# Patient Record
Sex: Female | Born: 1965 | Hispanic: Refuse to answer | Marital: Married | Smoking: Never smoker
Health system: Southern US, Community
[De-identification: ages and names within clinical notes are randomized; demographics above are authoritative.]

## PROBLEM LIST (undated history)

## (undated) DIAGNOSIS — Z8619 Personal history of other infectious and parasitic diseases: Secondary | ICD-10-CM

## (undated) DIAGNOSIS — K219 Gastro-esophageal reflux disease without esophagitis: Secondary | ICD-10-CM

## (undated) DIAGNOSIS — R12 Heartburn: Secondary | ICD-10-CM

## (undated) DIAGNOSIS — G43909 Migraine, unspecified, not intractable, without status migrainosus: Secondary | ICD-10-CM

## (undated) HISTORY — DX: Personal history of other infectious and parasitic diseases: Z86.19

## (undated) HISTORY — DX: Gastro-esophageal reflux disease without esophagitis: K21.9

## (undated) HISTORY — DX: Heartburn: R12

## (undated) HISTORY — DX: Migraine, unspecified, not intractable, without status migrainosus: G43.909

---

## 1997-04-25 HISTORY — PX: HEMORRHOIDECTOMY WITH HEMORRHOID BANDING: SHX5633

## 2013-04-25 DIAGNOSIS — B029 Zoster without complications: Secondary | ICD-10-CM

## 2013-04-25 HISTORY — DX: Zoster without complications: B02.9

## 2016-04-25 DIAGNOSIS — Z8619 Personal history of other infectious and parasitic diseases: Secondary | ICD-10-CM | POA: Insufficient documentation

## 2017-11-28 ENCOUNTER — Ambulatory Visit (INDEPENDENT_AMBULATORY_CARE_PROVIDER_SITE_OTHER): Payer: 59 | Admitting: Family Medicine

## 2017-11-28 ENCOUNTER — Encounter: Payer: Self-pay | Admitting: Family Medicine

## 2017-11-28 VITALS — BP 110/64 | HR 69 | Temp 98.4°F | Ht 67.0 in

## 2017-11-28 DIAGNOSIS — B86 Scabies: Secondary | ICD-10-CM | POA: Diagnosis not present

## 2017-11-28 MED ORDER — TRIAMCINOLONE ACETONIDE 0.1 % EX CREA: 1.0000 "application " | TOPICAL_CREAM | Freq: Two times a day (BID) | CUTANEOUS | 0 refills | Status: DC

## 2017-11-28 MED ORDER — PERMETHRIN 5 % EX CREA: TOPICAL_CREAM | CUTANEOUS | 1 refills | Status: DC

## 2017-11-28 MED ORDER — HYDROXYZINE HCL 25 MG PO TABS: 25.0000 mg | ORAL_TABLET | Freq: Three times a day (TID) | ORAL | 0 refills | Status: DC | PRN

## 2017-11-28 NOTE — Patient Instructions (Signed)
Scabies, Adult Scabies is a skin condition that happens when very small insects get under the skin (infestation). This causes a rash and severe itchiness. Scabies can spread from person to person (is contagious). If you get scabies, it is common for others in your household to get scabies too. With proper treatment, symptoms usually go away in 2-4 weeks. Scabies usually does not cause lasting problems. What are the causes? This condition is caused by mites (Sarcoptes scabiei, or human itch mites) that can only be seen with a microscope. The mites get into the top layer of skin and lay eggs. Scabies can spread from person to person through:  Close contact with a person who has scabies.  Contact with infested items, such as towels, bedding, or clothing.  What increases the risk? This condition is more likely to develop in:  People who live in nursing homes and other extended-care facilities.  People who have sexual contact with a partner who has scabies.  Young children who attend child care facilities.  People who care for others who are at increased risk for scabies.  What are the signs or symptoms? Symptoms of this condition may include:  Severe itchiness. This is often worse at night.  A rash that includes tiny red bumps or blisters. The rash commonly occurs on the wrist, elbow, armpit, fingers, waist, groin, or buttocks. Bumps may form a line (burrow) in some areas.  Skin irritation. This can include scaly patches or sores.  How is this diagnosed? This condition is diagnosed with a physical exam. Your health care provider will look closely at your skin. In some cases, your health care provider may take a sample of your affected skin (skin scraping) and have it examined under a microscope. How is this treated? This condition may be treated with:  Medicated cream or lotion that kills the mites. This is spread on the entire body and left on for several hours. Usually, one treatment  with medicated cream or lotion is enough to kill all of the mites. In severe cases, the treatment may be repeated.  Medicated cream that relieves itching.  Medicines that help to relieve itching.  Medicines that kill the mites. This treatment is rarely used.  Follow these instructions at home:  Medicines  Take or apply over-the-counter and prescription medicines as told by your health care provider.  Apply medicated cream or lotion as told by your health care provider.  Do not wash off the medicated cream or lotion until the necessary amount of time has passed. Skin Care  Avoid scratching your affected skin.  Keep your fingernails closely trimmed to reduce injury from scratching.  Take cool baths or apply cool washcloths to help reduce itching. General instructions  Clean all items that you recently had contact with, including bedding, clothing, and furniture. Do this on the same day that your treatment starts. ? Use hot water when you wash items. ? Place unwashable items into closed, airtight plastic bags for at least 3 days. The mites cannot live for more than 3 days away from human skin. ? Vacuum furniture and mattresses that you use.  Make sure that other people who may have been infested are examined by a health care provider. These include members of your household and anyone who may have had contact with infested items.  Keep all follow-up visits as told by your health care provider. This is important. Contact a health care provider if:  You have itching that does not go away   after 4 weeks of treatment.  You continue to develop new bumps or burrows.  You have redness, swelling, or pain in your rash area after treatment.  You have fluid, blood, or pus coming from your rash. This information is not intended to replace advice given to you by your health care provider. Make sure you discuss any questions you have with your health care provider. Document Released:  12/31/2014 Document Revised: 09/17/2015 Document Reviewed: 11/11/2014 Elsevier Interactive Patient Education  2018 Elsevier Inc.  

## 2017-11-28 NOTE — Progress Notes (Signed)
Patient: Katherine Sanford MRN: [id number] DOB: 01/14/1966 PCP: No primary care provider on file.     Subjective:  Chief Complaint  Patient presents with  . poss scabies  . itchy rash    HPI: The patient is a 52 y.o. female who presents today for an itchy rash and is concerned for scabies. She was in [place] last week and she started noticing the rash about the third day into her stay. Rash started on the back of her left knee. She states it spread to her right anterior chest. Itching is worse at night. She has areas on her hands, behind knee and then the anterior right chest wall. She has not put anything on it. She denies contacts with any new plants, animals. Did use new soap in [place] at the hotel.   Review of Systems  Constitutional: Negative for fatigue and fever.  HENT: Negative for congestion.   Respiratory: Negative for shortness of breath.   Cardiovascular: Negative for chest pain.  Gastrointestinal: Negative for abdominal pain and nausea.  Skin: Positive for rash.  Neurological: Negative for dizziness and headaches.  Psychiatric/Behavioral: The patient is not nervous/anxious.     Allergies Patient has No Known Allergies.  Past Medical History Patient  has a past medical history of Diabetes mellitus without complication (HCC), GERD (gastroesophageal reflux disease), and Migraines.  Surgical History Patient  has a past surgical history that includes Cesarean section (2004) and Hemorrhoidectomy with hemorrhoid banding (1999).  Family History Pateint's family history includes Alcohol abuse in her father; Diabetes in her sister; Heart attack in her father; Learning disabilities in her father; Stroke in her father.  Social History Patient  reports that she has never smoked. She has never used smokeless tobacco. She reports that she drank alcohol. She reports that she does not use drugs.    Objective: Vitals:   11/28/17 1112  BP: 110/64  Pulse: 69  Temp: 98.4 F (36.9 C)   TempSrc: Oral  SpO2: 96%  Height: 5\' 7"  (1.702 m)    There is no height or weight on file to calculate BMI.  Physical Exam  Constitutional: She appears well-developed and well-nourished.  Skin: Skin is warm and dry. Rash (very tiny erythematous papules on left wrist, right anterior chest wall and behind left knee. no burrows seen. nothing in webs of fingers or toes. ) noted.  Vitals reviewed.      Assessment/plan: 1. Scabies Not 100% convinced this is scabies. Could just be a dermatitis and such quick onset. Will treat with permethrin 5%, hydroxyzine prn for itching and can use topical steroid cream. Let me know if doesn't get better. Has f/u for establishing care/annual exam in 2 weeks. Also gave handout for clothes/bed washing, etc..     Return if symptoms worsen or fail to improve.     Orland MustardAllison Heraclio Seidman, MD [facility]  11/28/2017

## 2017-11-28 NOTE — Progress Notes (Deleted)
Patient: Katherine Sanford MRN: [id number] DOB: 08/31/1965 PCP: No primary care provider on file.     Subjective:  Chief Complaint  Patient presents with  . poss scabies  . itchy rash    HPI: The patient is a 52 y.o. female who presents today for ***  Review of Systems  Constitutional: Positive for chills. Negative for fever.  HENT: Positive for sore throat. Negative for congestion.   Respiratory: Negative for shortness of breath.   Cardiovascular: Negative for chest pain.  Gastrointestinal: Negative for abdominal pain and nausea.  Skin: Positive for rash.  Neurological: Negative for dizziness and headaches.  Psychiatric/Behavioral: The patient is not nervous/anxious.     Allergies Patient has No Known Allergies.  Past Medical History Patient  has no past medical history on file.  Surgical History Patient  has no past surgical history on file.  Family History Pateint's family history is not on file.  Social History Patient      Objective: Vitals:   11/28/17 1112  BP: 110/64  Pulse: 69  Temp: 98.4 F (36.9 C)  TempSrc: Oral  SpO2: 96%  Height: 5\' 7"  (1.702 m)    There is no height or weight on file to calculate BMI.  Physical Exam     Assessment/plan:      No follow-ups on file.     @[facility] @ 11/28/2017

## 2017-12-11 ENCOUNTER — Encounter: Payer: Self-pay | Admitting: Family Medicine

## 2017-12-11 ENCOUNTER — Ambulatory Visit: Payer: 59 | Admitting: Family Medicine

## 2017-12-11 VITALS — BP 104/70 | HR 67 | Temp 98.2°F | Ht 67.0 in | Wt 173.2 lb

## 2017-12-11 DIAGNOSIS — R5383 Other fatigue: Secondary | ICD-10-CM | POA: Diagnosis not present

## 2017-12-11 DIAGNOSIS — R140 Abdominal distension (gaseous): Secondary | ICD-10-CM | POA: Diagnosis not present

## 2017-12-11 DIAGNOSIS — Z0000 Encounter for general adult medical examination without abnormal findings: Secondary | ICD-10-CM | POA: Diagnosis not present

## 2017-12-11 DIAGNOSIS — K219 Gastro-esophageal reflux disease without esophagitis: Secondary | ICD-10-CM | POA: Diagnosis not present

## 2017-12-11 DIAGNOSIS — Z114 Encounter for screening for human immunodeficiency virus [HIV]: Secondary | ICD-10-CM

## 2017-12-11 DIAGNOSIS — Z23 Encounter for immunization: Secondary | ICD-10-CM

## 2017-12-11 DIAGNOSIS — Z1159 Encounter for screening for other viral diseases: Secondary | ICD-10-CM

## 2017-12-11 LAB — CBC WITH DIFFERENTIAL/PLATELET
BASOS PCT: 0.6 % (ref 0.0–3.0)
Basophils Absolute: 0 10*3/uL (ref 0.0–0.1)
EOS ABS: 0.1 10*3/uL (ref 0.0–0.7)
Eosinophils Relative: 1.5 % (ref 0.0–5.0)
HCT: 37.9 % (ref 36.0–46.0)
Hemoglobin: 12.5 g/dL (ref 12.0–15.0)
LYMPHS ABS: 1.6 10*3/uL (ref 0.7–4.0)
Lymphocytes Relative: 28.8 % (ref 12.0–46.0)
MCHC: 32.9 g/dL (ref 30.0–36.0)
MCV: 87.1 fl (ref 78.0–100.0)
MONO ABS: 0.4 10*3/uL (ref 0.1–1.0)
Monocytes Relative: 6.8 % (ref 3.0–12.0)
NEUTROS ABS: 3.4 10*3/uL (ref 1.4–7.7)
NEUTROS PCT: 62.3 % (ref 43.0–77.0)
PLATELETS: 362 10*3/uL (ref 150.0–400.0)
RBC: 4.35 Mil/uL (ref 3.87–5.11)
RDW: 14.5 % (ref 11.5–15.5)
WBC: 5.5 10*3/uL (ref 4.0–10.5)

## 2017-12-11 LAB — TSH: TSH: 1.89 u[IU]/mL (ref 0.35–4.50)

## 2017-12-11 LAB — COMPREHENSIVE METABOLIC PANEL
ALT: 20 U/L (ref 0–35)
AST: 18 U/L (ref 0–37)
Albumin: 4.4 g/dL (ref 3.5–5.2)
Alkaline Phosphatase: 77 U/L (ref 39–117)
BUN: 12 mg/dL (ref 6–23)
CHLORIDE: 104 meq/L (ref 96–112)
CO2: 27 meq/L (ref 19–32)
CREATININE: 0.82 mg/dL (ref 0.40–1.20)
Calcium: 10 mg/dL (ref 8.4–10.5)
GFR: 77.2 mL/min (ref >60.00)
GLUCOSE: 104 mg/dL — AB (ref 70–99)
Potassium: 4 mEq/L (ref 3.5–5.1)
SODIUM: 138 meq/L (ref 135–145)
Total Bilirubin: 0.4 mg/dL (ref 0.2–1.2)
Total Protein: 7.4 g/dL (ref 6.0–8.3)

## 2017-12-11 LAB — LIPID PANEL
CHOLESTEROL: 206 mg/dL — AB (ref 0–200)
HDL: 65.8 mg/dL (ref >39.00)
LDL Cholesterol: 125 mg/dL — ABNORMAL HIGH (ref 0–99)
NonHDL: 140.59
Total CHOL/HDL Ratio: 3
Triglycerides: 77 mg/dL (ref 0.0–149.0)
VLDL: 15.4 mg/dL (ref 0.0–40.0)

## 2017-12-11 LAB — VITAMIN D 25 HYDROXY (VIT D DEFICIENCY, FRACTURES): VITD: 27.07 ng/mL — AB (ref 30.00–100.00)

## 2017-12-11 NOTE — Patient Instructions (Signed)
-  if you need an optometrist Bunnie PionEric Ablott at [facility] is great.   -FODMOP diet for foods that cause gas/bloating  -LOTS of labs... Watch pain in left lower side and if is not going away we will need to do an ultrasound.    -start zantac 150mg  TWICE a day. If not better in a few days, email me and we will start prescription medication for the reflux   -ask your husband if you stop breathing and gasp for air at night. If so, we need to do a sleep study on you to make sure you don't have sleep apnea.

## 2017-12-11 NOTE — Progress Notes (Signed)
Patient: Katherine Sanford MRN: [id number] DOB: 1965/05/28 PCP: Orland Mustard, MD     Subjective:  Chief Complaint  Patient presents with  . Establish Care    cpe  . bloating, fatigue    HPI: The patient is a 52 y.o. female who presents today for annual exam. She denies any changes to past medical history. There have been no recent hospitalizations. They are following a well balanced diet and exercise plan. Weight has increasing. She has some issues to address today. Her mom and sister were just diagnosed with thyroid disease (hypothyroid) and diabetes. She also has other family members who have thyroid disease. She also feels more tired and bloating.   Fatigue: She noticed this about 6 months ago. She is up to date on her colonoscopy. Not her mmg or pap smear. She does feel like she bruises easily. She gest hot flashes at night, but no night sweats. No fever/chills and no weight loss. No family history of cancers. She does walk for exercise. She does not sleep much due to acid reflux. Sleeps 7-8 hours. She states her husband says she snores, but she is unsure if she gasps for air.   GERD: has history of this. Has only been taking tums with no relief and it's keeping her up at night. She has had this bad for the past month. Before this it was mild and intermittent. She has the burning in her throat and a sore throat. She has an intermittent cough. She went to [place] in [place]. No stomach pain. She had an EGD done 1.5 years ago and thinks she had h.pylori.   Bloating: she feels like she is bloated all of the time and noticed this about 6 months ago. She tries to eat healthy. She has a lot of gas as well with this. She has gained weight-30 pounds over the last year. No fh of ovarian cancer.  No diet changes over the last year. She did do a big move to [place] from [place] last year. She does have intermittent left adnexal pain, but it's never steady and goes away. She has intermittent constipation  that she has lived with her entire life.   Immunization History  Administered Date(s) Administered  . Tdap 12/11/2017     Colonoscopy: 2018. F/u in 10 years. She will get results for me.  Mammogram: needs this.  Pap smear: 8 years ago.  Tdap: today  Hep C screen today   Review of Systems  Constitutional: Positive for fatigue. Negative for chills and fever.  HENT: Negative for dental problem, ear pain, hearing loss and trouble swallowing.   Eyes: Negative for visual disturbance.  Respiratory: Negative for cough, chest tightness and shortness of breath.   Cardiovascular: Negative for chest pain, palpitations and leg swelling.  Gastrointestinal: Positive for constipation. Negative for abdominal pain, blood in stool, diarrhea and nausea.  Endocrine: Positive for heat intolerance. Negative for cold intolerance, polydipsia, polyphagia and polyuria.       C/o excessive hot flashes  Genitourinary: Negative for dysuria and hematuria.  Musculoskeletal: Negative for arthralgias and neck pain.  Skin: Negative for rash.  Neurological: Negative for dizziness and headaches.  Psychiatric/Behavioral: Positive for sleep disturbance. Negative for dysphoric mood. The patient is not nervous/anxious.     Allergies Patient has No Known Allergies.  Past Medical History Patient  has a past medical history of GERD (gastroesophageal reflux disease) and Migraines.  Surgical History Patient  has a past surgical history that includes Cesarean section (  2004) and Hemorrhoidectomy with hemorrhoid banding (1999).  Family History Pateint's family history includes Alcohol abuse in her father; Diabetes in her sister; Heart attack in her father; Learning disabilities in her father; Stroke in her father.  Social History Patient  reports that she has never smoked. She has never used smokeless tobacco. She reports that she drank alcohol. She reports that she does not use drugs.    Objective: Vitals:   12/11/17  0820  BP: 104/70  Pulse: 67  Temp: 98.2 F (36.8 C)  TempSrc: Oral  SpO2: 98%  Weight: 173 lb 3.2 oz (78.6 kg)  Height: 5\' 7"  (1.702 m)    Body mass index is 27.13 kg/m.  Physical Exam  Constitutional: She is oriented to person, place, and time. She appears well-developed and well-nourished.  HENT:  Right Ear: External ear normal.  Left Ear: External ear normal.  Mouth/Throat: Oropharynx is clear and moist.  TM pearly with light reflex bilaterally.   Eyes: Pupils are equal, round, and reactive to light. Conjunctivae and EOM are normal.  Neck: Normal range of motion. Neck supple. No thyromegaly present.  Cardiovascular: Normal rate, regular rhythm, normal heart sounds and intact distal pulses.  No murmur heard. No carotid bruits   Pulmonary/Chest: Effort normal and breath sounds normal.  Abdominal: Soft. Bowel sounds are normal. She exhibits no distension. There is tenderness (LLQ and epigastric ). There is no guarding.  Lymphadenopathy:    She has no cervical adenopathy.  Neurological: She is alert and oriented to person, place, and time. She displays normal reflexes. No cranial nerve deficit. Coordination normal.  Skin: Skin is warm and dry. No rash noted.  Dermatofibroma on left lower leg  Psychiatric: She has a normal mood and affect. Her behavior is normal.  Vitals reviewed.      Assessment/plan: 1. Need for prophylactic vaccination with combined diphtheria-tetanus-pertussis (DTP) vaccine  - Tdap vaccine greater than or equal to 7yo IM  2. Annual physical exam Routine lab work today. mmg information given and she will f/u with her GYN for pap smear. tday today and hep C screen as well. Skin check done continue sunscreen use. Work on incorporating more exercise into her routine. F/u in one year for annual Patient counseling [x]    Nutrition: Stressed importance of moderation in sodium/caffeine intake, saturated fat and cholesterol, caloric balance, sufficient intake of  fresh fruits, vegetables, fiber, calcium, iron, and 1 mg of folate supplement per day (for females capable of pregnancy).  [x]    Stressed the importance of regular exercise.   []    Substance Abuse: Discussed cessation/primary prevention of tobacco, alcohol, or other drug use; driving or other dangerous activities under the influence; availability of treatment for abuse.   [x]    Injury prevention: Discussed safety belts, safety helmets, smoke detector, smoking near bedding or upholstery.   [x]    Sexuality: Discussed sexually transmitted diseases, partner selection, use of condoms, avoidance of unintended pregnancy  and contraceptive alternatives.  [x]    Dental health: Discussed importance of regular tooth brushing, flossing, and dental visits.  [x]    Health maintenance and immunizations reviewed. Please refer to Health maintenance section.   - Comprehensive metabolic panel - CBC with Differential/Platelet - TSH - Lipid panel  3. Encounter for screening for HIV  - HIV antibody  4. Encounter for hepatitis C screening test for low risk patient  - Hepatitis C antibody  5. Bloating She has left adnexal pain on exam with bloating and weight gain. No Fh of  ovarian cancer, but will check ca-125. Handout given for FODMOP diet. utd on colonoscopy and requesting records. If not better with pain in llq will ultrasound her.  - CA 125  6. Gastroesophageal reflux disease without esophagitis H.pylori test today. Starting her on zantac bid. If not better in 1-2 weeks will start her on PPI and if h.pylori will treat.  - H. pylori breath test  7. Other fatigue Labs. F/u on sleep, may need sleep study. Increase exercise. See her back as needed/depending on lab work.  - VITAMIN D 25 Hydroxy (Vit-D Deficiency, Fractures)    Return if symptoms worsen or fail to improve.     Orland MustardAllison Letecia Arps, MD [facility]  12/11/2017

## 2017-12-12 LAB — H. PYLORI BREATH TEST: H. pylori Breath Test: NOT DETECTED

## 2017-12-13 ENCOUNTER — Other Ambulatory Visit: Payer: Self-pay | Admitting: Family Medicine

## 2017-12-13 DIAGNOSIS — E559 Vitamin D deficiency, unspecified: Secondary | ICD-10-CM | POA: Insufficient documentation

## 2017-12-13 LAB — HEPATITIS C ANTIBODY
Hepatitis C Ab: NONREACTIVE
SIGNAL TO CUT-OFF: 0.01 (ref <1.00)

## 2017-12-13 LAB — HIV ANTIBODY (ROUTINE TESTING W REFLEX): HIV: NONREACTIVE

## 2017-12-13 LAB — CA 125: CA 125: 3 U/mL (ref <35)

## 2017-12-13 MED ORDER — VITAMIN D (ERGOCALCIFEROL) 1.25 MG (50000 UNIT) PO CAPS
ORAL_CAPSULE | ORAL | 0 refills | Status: DC
Start: 2017-12-13 — End: 2019-03-06

## 2019-03-05 ENCOUNTER — Ambulatory Visit: Payer: 59 | Admitting: Family Medicine

## 2019-03-06 ENCOUNTER — Encounter: Payer: Self-pay | Admitting: Family Medicine

## 2019-03-06 ENCOUNTER — Ambulatory Visit (INDEPENDENT_AMBULATORY_CARE_PROVIDER_SITE_OTHER): Payer: 59 | Admitting: Family Medicine

## 2019-03-06 ENCOUNTER — Other Ambulatory Visit: Payer: Self-pay

## 2019-03-06 VITALS — BP 108/66 | HR 65 | Temp 97.6°F | Ht 67.0 in | Wt 156.5 lb

## 2019-03-06 DIAGNOSIS — B354 Tinea corporis: Secondary | ICD-10-CM

## 2019-03-06 MED ORDER — KETOCONAZOLE 2 % EX CREA: 1.0000 "application " | TOPICAL_CREAM | Freq: Two times a day (BID) | CUTANEOUS | 0 refills | Status: AC

## 2019-03-06 NOTE — Patient Instructions (Signed)
Please return in 2-4 weeks with Dr. Artis Flock for recheck and to discuss sleep concerns. You may also schedule a visit for a complete physical.   If you have any questions or concerns, please don't hesitate to send me a message via MyChart or call the office at [phone]. Thank you for visiting with [place] today! It's our pleasure caring for you.   Body Ringworm Body ringworm is an infection of the skin that often causes a ring-shaped rash. Body ringworm is also called tinea corporis. Body ringworm can affect any part of your skin. This condition is easily spread from person to person (is very contagious). What are the causes? This condition is caused by fungi called dermatophytes. The condition develops when these fungi grow out of control on the skin. You can get this condition if you touch a person or animal that has it. You can also get it if you share any items with an infected person or pet. These include:  Clothing, bedding, and towels.  Brushes or combs.  Gym equipment.  Any other object that has the fungus on it. What increases the risk? You are more likely to develop this condition if you:  Play sports that involve close physical contact, such as wrestling.  Sweat a lot.  Live in areas that are hot and humid.  Use public showers.  Have a weakened immune system. What are the signs or symptoms? Symptoms of this condition include:  Itchy, raised red spots and bumps.  Red scaly patches.  A ring-shaped rash. The rash may have: ? A clear center. ? Scales or red bumps at its center. ? Redness near its borders. ? Dry and scaly skin on or around it. How is this diagnosed? This condition can usually be diagnosed with a skin exam. A skin scraping may be taken from the affected area and examined under a microscope to see if the fungus is present. How is this treated? This condition may be treated with:  An antifungal cream or ointment.  An antifungal shampoo.  Antifungal  medicines. These may be prescribed if your ringworm: ? Is severe. ? Keeps coming back. ? Lasts a long time. Follow these instructions at home:  Take over-the-counter and prescription medicines only as told by your health care provider.  If you were given an antifungal cream or ointment: ? Use it as told by your health care provider. ? Wash the infected area and dry it completely before applying the cream or ointment.  If you were given an antifungal shampoo: ? Use it as told by your health care provider. ? Leave the shampoo on your body for 3-5 minutes before rinsing.  While you have a rash: ? Wear loose clothing to stop clothes from rubbing and irritating it. ? Wash or change your bed sheets every night. ? Disinfect or throw out items that may be infected. ? Wash clothes and bed sheets in hot water. ? Wash your hands often with soap and water. If soap and water are not available, use hand sanitizer.  If your pet has the same infection, take your pet to see a veterinarian for treatment. How is this prevented?  Take a bath or shower every day and after every time you work out or play sports.  Dry your skin completely after bathing.  Wear sandals or shoes in public places and showers.  Change your clothes every day.  Wash athletic clothes after each use.  Do not share personal items with others.  Avoid  touching red patches of skin on other people.  Avoid touching pets that have bald spots.  If you touch an animal that has a bald spot, wash your hands. Contact a health care provider if:  Your rash continues to spread after 7 days of treatment.  Your rash is not gone in 4 weeks.  The area around your rash gets red, warm, tender, and swollen. Summary  Body ringworm is an infection of the skin that often causes a ring-shaped rash.  This condition is easily spread from person to person (is very contagious).  This condition may be treated with antifungal cream or  ointment, antifungal shampoo, or antifungal medicines.  Take over-the-counter and prescription medicines only as told by your health care provider. This information is not intended to replace advice given to you by your health care provider. Make sure you discuss any questions you have with your health care provider. Document Released: 04/08/2000 Document Revised: 12/08/2017 Document Reviewed: 12/08/2017 Elsevier Patient Education  2020 [employer].

## 2019-03-06 NOTE — Progress Notes (Signed)
   Subjective  CC:  Chief Complaint  Patient presents with  . Rash    Rt upper chest, red     HPI: Katherine Sanford is a 53 y.o. female who presents to the office today to address the problems listed above in the chief complaint.  53 yo healthy female with one week h/o red itchy growing round rash on right upper chest. No systemic or associated sxs. Otherwise feels well. No other rash or spread. No affected family members.   HM: overdue for cpe. Declines influenza vaccination today.    Assessment  1. Tinea corporis      Plan   Ringworm, body:  Most c/w ringworm. Trial of ketoconazole. F/u if not improving.   rec cpe visit.   Follow up: No follow-ups on file.  05/08/2019  No orders of the defined types were placed in this encounter.  Meds ordered this encounter  Medications  . ketoconazole (NIZORAL) 2 % cream    Sig: Apply 1 application topically 2 (two) times daily for 7 days. Then as needed    Dispense:  30 g    Refill:  0      I reviewed the patients updated PMH, FH, and SocHx.    Patient Active Problem List   Diagnosis Date Noted  . Vitamin D deficiency 12/13/2017   Current Meds  Medication Sig  . acetaminophen (TYLENOL) 325 MG tablet Take 325 mg by mouth every 6 (six) hours as needed.    Allergies: Patient has No Known Allergies. Family History: Patient family history includes Alcohol abuse in her father; Diabetes in her sister; Heart attack in her father; Learning disabilities in her father; Stroke in her father. Social History:  Patient  reports that she has never smoked. She has never used smokeless tobacco. She reports previous alcohol use. She reports that she does not use drugs.  Review of Systems: Constitutional: Negative for fever malaise or anorexia Cardiovascular: negative for chest pain Respiratory: negative for SOB or persistent cough Gastrointestinal: negative for abdominal pain  Objective  Vitals: BP 108/66 (BP Location: Left Arm,  Patient Position: Sitting, Cuff Size: Normal)   Pulse 65   Temp 97.6 F (36.4 C) (Temporal)   Ht 5\' 7"  (1.702 m)   Wt 156 lb 8 oz (71 kg)   LMP  (LMP Unknown) Comment: LMP 2014  SpO2 99%   BMI 24.51 kg/m  General: no acute distress , A&Ox3, appears well Skin:  Warm, right upper chest wall with approx 2cm annular lesion with mild central clearing and flaking. No vesicles. Nontender.     Commons side effects, risks, benefits, and alternatives for medications and treatment plan prescribed today were discussed, and the patient expressed understanding of the given instructions. Patient is instructed to call or message via MyChart if he/she has any questions or concerns regarding our treatment plan. No barriers to understanding were identified. We discussed Red Flag symptoms and signs in detail. Patient expressed understanding regarding what to do in case of urgent or emergency type symptoms.   Medication list was reconciled, printed and provided to the patient in AVS. Patient instructions and summary information was reviewed with the patient as documented in the AVS. This note was prepared with assistance of Dragon voice recognition software. Occasional wrong-word or sound-a-like substitutions may have occurred due to the inherent limitations of voice recognition software

## 2019-04-03 ENCOUNTER — Ambulatory Visit (INDEPENDENT_AMBULATORY_CARE_PROVIDER_SITE_OTHER): Payer: 59 | Admitting: Family Medicine

## 2019-04-03 ENCOUNTER — Other Ambulatory Visit: Payer: Self-pay

## 2019-04-03 ENCOUNTER — Encounter: Payer: Self-pay | Admitting: Family Medicine

## 2019-04-03 VITALS — BP 110/72 | HR 68 | Temp 97.6°F | Ht 67.0 in | Wt 157.4 lb

## 2019-04-03 DIAGNOSIS — Z0000 Encounter for general adult medical examination without abnormal findings: Secondary | ICD-10-CM

## 2019-04-03 DIAGNOSIS — K219 Gastro-esophageal reflux disease without esophagitis: Secondary | ICD-10-CM | POA: Diagnosis not present

## 2019-04-03 DIAGNOSIS — R1013 Epigastric pain: Secondary | ICD-10-CM

## 2019-04-03 DIAGNOSIS — R202 Paresthesia of skin: Secondary | ICD-10-CM

## 2019-04-03 DIAGNOSIS — R739 Hyperglycemia, unspecified: Secondary | ICD-10-CM

## 2019-04-03 LAB — LIPID PANEL
Cholesterol: 199 mg/dL (ref 0–200)
HDL: 62.9 mg/dL (ref >39.00)
LDL Cholesterol: 126 mg/dL — ABNORMAL HIGH (ref 0–99)
NonHDL: 136.14
Total CHOL/HDL Ratio: 3
Triglycerides: 51 mg/dL (ref 0.0–149.0)
VLDL: 10.2 mg/dL (ref 0.0–40.0)

## 2019-04-03 LAB — COMPREHENSIVE METABOLIC PANEL
ALT: 18 U/L (ref 0–35)
AST: 18 U/L (ref 0–37)
Albumin: 4.5 g/dL (ref 3.5–5.2)
Alkaline Phosphatase: 60 U/L (ref 39–117)
BUN: 16 mg/dL (ref 6–23)
CO2: 29 mEq/L (ref 19–32)
Calcium: 9.4 mg/dL (ref 8.4–10.5)
Chloride: 104 mEq/L (ref 96–112)
Creatinine, Ser: 0.74 mg/dL (ref 0.40–1.20)
GFR: 81.38 mL/min (ref >60.00)
Glucose, Bld: 104 mg/dL — ABNORMAL HIGH (ref 70–99)
Potassium: 4.3 mEq/L (ref 3.5–5.1)
Sodium: 138 mEq/L (ref 135–145)
Total Bilirubin: 0.3 mg/dL (ref 0.2–1.2)
Total Protein: 7 g/dL (ref 6.0–8.3)

## 2019-04-03 LAB — VITAMIN D 25 HYDROXY (VIT D DEFICIENCY, FRACTURES): VITD: 24.96 ng/mL — ABNORMAL LOW (ref 30.00–100.00)

## 2019-04-03 LAB — CBC WITH DIFFERENTIAL/PLATELET
Basophils Absolute: 0 10*3/uL (ref 0.0–0.1)
Basophils Relative: 1 % (ref 0.0–3.0)
Eosinophils Absolute: 0.1 10*3/uL (ref 0.0–0.7)
Eosinophils Relative: 2.5 % (ref 0.0–5.0)
HCT: 36.8 % (ref 36.0–46.0)
Hemoglobin: 12.1 g/dL (ref 12.0–15.0)
Lymphocytes Relative: 32.8 % (ref 12.0–46.0)
Lymphs Abs: 1.3 10*3/uL (ref 0.7–4.0)
MCHC: 33 g/dL (ref 30.0–36.0)
MCV: 87.3 fl (ref 78.0–100.0)
Monocytes Absolute: 0.4 10*3/uL (ref 0.1–1.0)
Monocytes Relative: 10.9 % (ref 3.0–12.0)
Neutro Abs: 2 10*3/uL (ref 1.4–7.7)
Neutrophils Relative %: 52.8 % (ref 43.0–77.0)
Platelets: 311 10*3/uL (ref 150.0–400.0)
RBC: 4.22 Mil/uL (ref 3.87–5.11)
RDW: 13.6 % (ref 11.5–15.5)
WBC: 3.8 10*3/uL — ABNORMAL LOW (ref 4.0–10.5)

## 2019-04-03 LAB — HEMOGLOBIN A1C: Hgb A1c MFr Bld: 5.6 % (ref 4.6–6.5)

## 2019-04-03 LAB — TSH: TSH: 1.35 u[IU]/mL (ref 0.35–4.50)

## 2019-04-03 LAB — VITAMIN B12: Vitamin B-12: 752 pg/mL (ref 211–911)

## 2019-04-03 MED ORDER — DICLOFENAC SODIUM 1 % EX GEL: 4.0000 g | Freq: Four times a day (QID) | CUTANEOUS | 1 refills | Status: DC

## 2019-04-03 NOTE — Patient Instructions (Addendum)
I want you to get thumb spica splints for both of your hands and wear them at night. Also put on voltaren gel up to four times a day. If this doesn't help. Please let me know.   For reflux I want you to get over the counter medication: prilosec, nexium. Take every morning for a month before you eat. If not getting better we may need to send you to GI. Stay away from NSAIDs, spicy/greasy fatty foods.     Carpal Tunnel Syndrome  Carpal tunnel syndrome is a condition that causes pain in your hand and arm. The carpal tunnel is a narrow area that is on the palm side of your wrist. Repeated wrist motion or certain diseases may cause swelling in the tunnel. This swelling can pinch the main nerve in the wrist (median nerve). What are the causes? This condition may be caused by:  Repeated wrist motions.  Wrist injuries.  Arthritis.  A sac of fluid (cyst) or abnormal growth (tumor) in the carpal tunnel.  Fluid buildup during pregnancy. Sometimes the cause is not known. What increases the risk? The following factors may make you more likely to develop this condition:  Having a job in which you move your wrist in the same way many times. This includes jobs like being a Software engineer or a Scientist, water quality.  Being a woman.  Having other health conditions, such as: ? Diabetes. ? Obesity. ? A thyroid gland that is not active enough (hypothyroidism). ? Kidney failure. What are the signs or symptoms? Symptoms of this condition include:  A tingling feeling in your fingers.  Tingling or a loss of feeling (numbness) in your hand.  Pain in your entire arm. This pain may get worse when you bend your wrist and elbow for a long time.  Pain in your wrist that goes up your arm to your shoulder.  Pain that goes down into your palm or fingers.  A weak feeling in your hands. You may find it hard to grab and hold items. You may feel worse at night. How is this diagnosed? This condition is diagnosed with a medical  history and physical exam. You may also have tests, such as:  Electromyogram (EMG). This test checks the signals that the nerves send to the muscles.  Nerve conduction study. This test checks how well signals pass through your nerves.  Imaging tests, such as X-rays, ultrasound, and MRI. These tests check for what might be the cause of your condition. How is this treated? This condition may be treated with:  Lifestyle changes. You will be asked to stop or change the activity that caused your problem.  Doing exercise and activities that make bones and muscles stronger (physical therapy).  Learning how to use your hand again (occupational therapy).  Medicines for pain and swelling (inflammation). You may have injections in your wrist.  A wrist splint.  Surgery. Follow these instructions at home: If you have a splint:  Wear the splint as told by your doctor. Remove it only as told by your doctor.  Loosen the splint if your fingers: ? Tingle. ? Lose feeling (become numb). ? Turn cold and blue.  Keep the splint clean.  If the splint is not waterproof: ? Do not let it get wet. ? Cover it with a watertight covering when you take a bath or a shower. Managing pain, stiffness, and swelling   If told, put ice on the painful area: ? If you have a removable splint, remove it  as told by your doctor. ? Put ice in a plastic bag. ? Place a towel between your skin and the bag. ? Leave the ice on for 20 minutes, 2-3 times per day. General instructions  Take over-the-counter and prescription medicines only as told by your doctor.  Rest your wrist from any activity that may cause pain. If needed, talk with your boss at work about changes that can help your wrist heal.  Do any exercises as told by your doctor, physical therapist, or occupational therapist.  Keep all follow-up visits as told by your doctor. This is important. Contact a doctor if:  You have new symptoms.  Medicine  does not help your pain.  Your symptoms get worse. Get help right away if:  You have very bad numbness or tingling in your wrist or hand. Summary  Carpal tunnel syndrome is a condition that causes pain in your hand and arm.  It is often caused by repeated wrist motions.  Lifestyle changes and medicines are used to treat this problem. Surgery may help in very bad cases.  Follow your doctor's instructions about wearing a splint, resting your wrist, keeping follow-up visits, and calling for help. This information is not intended to replace advice given to you by your health care provider. Make sure you discuss any questions you have with your health care provider. Document Released: 03/31/2011 Document Revised: 08/18/2017 Document Reviewed: 08/18/2017 Elsevier Patient Education  2020 [employer].

## 2019-04-03 NOTE — Progress Notes (Signed)
Patient: Katherine Sanford MRN: [id number] DOB: 1965/08/21 PCP: Orma Flaming, MD     Subjective:  Chief Complaint  Patient presents with  . Follow-up  . arm numbness    pt c/o bilateral arm numbness when she sleeps and her hair is falling out  . Gastroesophageal Reflux    HPI: The patient is a 53 y.o. female who presents today for annual exam. She denies any changes to past medical history. There have been no recent hospitalizations. They are following a well balanced diet and exercise plan. Weight has been stable. Complaints below today.   1) she states when she sleeps at night her arms and hands go numb and hurt. She will end of trying to change positions, but she is a side sleeper. It happens if she puts weight on either side. She also has some tingling in her legs that happens after she has been sitting for awhile with legs crossed. Feels like legs are falling asleep. She states she has a hard time standing up and has to shake her legs and stomp. She has no problems exercising/walking. No weakness in her legs or arms. It happens after she has been sitting for a bit. No family history of auto immune issues, neuropathy. She has no personal history of diabetes or hypertension.   2) she is also having reflux. She states this started a while ago and then got a little bit better. It still bothers her daily and at night. Her throat gets dry at night as well. She has a burning sensation that come up her stomach and into her chest. She has no coughing, but has to clear her throat a few times. She did go to [place] this summer. She does have epigastric pain at times. No N/V. She has had black stools in the past. No blood in her stool. She only drinks one cup of coffee/day. No other caffeine. No NSAIDS/no fried/fatty foods. She will occasionally eat spicy foods. They have well water. Uses filter at home though. Has not used anything over the counter to treat this. Has tried natural  remedies-dandilion tea. I tested her for h.pylori last year and it was negative. Also tried zantac and it didn't help her.   Immunization History  Administered Date(s) Administered  . Tdap 12/11/2017   Colonoscopy: 05/09/2016. Unsure about f/u  Mammogram: over due for this.  Pap smear: overdue for this.  Declines flu shot.   Review of Systems  Constitutional: Negative for chills, fatigue and fever.  HENT: Negative for dental problem, ear pain, hearing loss and trouble swallowing.   Eyes: Negative for visual disturbance.  Respiratory: Negative for cough, chest tightness and shortness of breath.   Cardiovascular: Negative for chest pain, palpitations and leg swelling.  Gastrointestinal: Positive for abdominal pain. Negative for blood in stool, diarrhea, nausea and vomiting.  Endocrine: Negative for cold intolerance, polydipsia, polyphagia and polyuria.  Genitourinary: Negative for dysuria and hematuria.  Musculoskeletal: Negative for arthralgias.  Skin: Negative for rash.  Neurological: Positive for numbness (tingling in extremities ). Negative for dizziness, tremors, weakness and headaches.  Psychiatric/Behavioral: Negative for dysphoric mood and sleep disturbance. The patient is not nervous/anxious.     Allergies Patient has No Known Allergies.  Past Medical History Patient  has a past medical history of GERD (gastroesophageal reflux disease) and Migraines.  Surgical History Patient  has a past surgical history that includes Cesarean section (2004) and Hemorrhoidectomy with hemorrhoid banding (1999).  Family History Pateint's family history includes Alcohol  abuse in her father; Diabetes in her sister; Heart attack in her father; Learning disabilities in her father; Stroke in her father.  Social History Patient  reports that she has never smoked. She has never used smokeless tobacco. She reports previous alcohol use. She reports that she does not use drugs.     Objective: Vitals:   04/03/19 0802  BP: 110/72  Pulse: 68  Temp: 97.6 F (36.4 C)  SpO2: 99%  Weight: 157 lb 6.4 oz (71.4 kg)  Height: 5\' 7"  (1.702 m)    Body mass index is 24.65 kg/m.  Physical Exam Vitals signs reviewed.  Constitutional:      Appearance: She is well-developed.  HENT:     Right Ear: External ear normal.     Left Ear: External ear normal.  Eyes:     Conjunctiva/sclera: Conjunctivae normal.     Pupils: Pupils are equal, round, and reactive to light.  Neck:     Musculoskeletal: Normal range of motion and neck supple.     Thyroid: No thyromegaly.  Cardiovascular:     Rate and Rhythm: Normal rate and regular rhythm.     Heart sounds: Normal heart sounds. No murmur.  Pulmonary:     Effort: Pulmonary effort is normal.     Breath sounds: Normal breath sounds.  Abdominal:     General: Bowel sounds are normal. There is no distension.     Palpations: Abdomen is soft.     Tenderness: There is no abdominal tenderness.  Lymphadenopathy:     Cervical: No cervical adenopathy.  Skin:    General: Skin is warm and dry.     Findings: No rash.  Neurological:     General: No focal deficit present.     Mental Status: She is alert and oriented to person, place, and time.     Cranial Nerves: No cranial nerve deficit.     Sensory: No sensory deficit.     Coordination: Coordination normal.     Deep Tendon Reflexes: Reflexes normal.     Comments: +tinel's  bilaterally, but left >right. Negative phalen signs.  Strength intact including grip, upper and lower extremities.   Psychiatric:        Mood and Affect: Mood normal.        Behavior: Behavior normal.        Assessment/plan: 1. Annual physical exam Routine lab work today. Needs mmg/pap smear and she will make these appointments. Continue diet/exercise and health lifestyle choices. F/u in one year or as needed.  Patient counseling [x]    Nutrition: Stressed importance of moderation in sodium/caffeine intake,  saturated fat and cholesterol, caloric balance, sufficient intake of fresh fruits, vegetables, fiber, calcium, iron, and 1 mg of folate supplement per day (for females capable of pregnancy).  [x]    Stressed the importance of regular exercise.   []    Substance Abuse: Discussed cessation/primary prevention of tobacco, alcohol, or other drug use; driving or other dangerous activities under the influence; availability of treatment for abuse.   [x]    Injury prevention: Discussed safety belts, safety helmets, smoke detector, smoking near bedding or upholstery.   [x]    Sexuality: Discussed sexually transmitted diseases, partner selection, use of condoms, avoidance of unintended pregnancy  and contraceptive alternatives.  [x]    Dental health: Discussed importance of regular tooth brushing, flossing, and dental visits.  [x]    Health maintenance and immunizations reviewed. Please refer to Health maintenance section.    - CBC with Differential/Platelet - Comprehensive metabolic panel -  Lipid panel - TSH - VITAMIN D 25 Hydroxy (Vit-D Deficiency, Fractures)  2. 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 Starting voltaren gel qid and thumb spica splints at night. Will f/u with her in 3 months. For her legs, sounds like normal sensation after sitting and compressing legs for long periods of time. Reassured her no abnormal neuro exam today on lower extremities. Checking tsh/b12. Recommended keeping a log a well. If not getting better or worse we can have her f/u with neurology.  - TSH - Vitamin B12  3. Epigastric pain See below h/pylori negative in past. Does not want to do today. Likely gastritis. Starting PPI.   4. Gastroesophageal reflux disease, unspecified whether esophagitis present We are going to start with over the counter PPI. Recommended prilosec or nexium. Would like her to take qam x 1 month. If not getting better will do px strength. She would like to start with this and will let me  know in a week or two if this is not working. If no improvement with px strength PPI will send to GI.   5. Elevated blood sugar  - Hemoglobin A1c; Future - Hemoglobin A1c  This visit occurred during the SARS-CoV-2 public health emergency.  Safety protocols were in place, including screening questions prior to the visit, additional usage of staff PPE, and extensive cleaning of exam room while observing appropriate contact time as indicated for disinfecting solutions.     Return in about 3 months (around 07/02/2019) for gerd/legs/hands.    Orland Mustard, MD [facility]  04/03/2019

## 2019-04-05 ENCOUNTER — Other Ambulatory Visit: Payer: Self-pay | Admitting: Family Medicine

## 2019-04-05 MED ORDER — VITAMIN D (ERGOCALCIFEROL) 1.25 MG (50000 UNIT) PO CAPS: ORAL_CAPSULE | ORAL | 0 refills | Status: DC

## 2019-04-10 ENCOUNTER — Telehealth: Payer: Self-pay

## 2019-04-10 NOTE — Telephone Encounter (Signed)
Left vm message for patient to return call regarding 1/13 appt for cpe.  Patient just had cpe on 12/9.  Need to find out if she is keeping appt for another reason or did she forget to cancel appt?

## 2019-05-08 ENCOUNTER — Encounter: Payer: 59 | Admitting: Family Medicine

## 2019-06-14 ENCOUNTER — Other Ambulatory Visit: Payer: Self-pay

## 2019-06-14 ENCOUNTER — Ambulatory Visit (INDEPENDENT_AMBULATORY_CARE_PROVIDER_SITE_OTHER): Payer: 59 | Admitting: Family Medicine

## 2019-06-14 ENCOUNTER — Telehealth: Payer: Self-pay | Admitting: Family Medicine

## 2019-06-14 ENCOUNTER — Encounter: Payer: Self-pay | Admitting: Family Medicine

## 2019-06-14 VITALS — BP 100/70 | HR 79 | Temp 97.6°F | Ht 67.0 in | Wt 156.2 lb

## 2019-06-14 DIAGNOSIS — M5432 Sciatica, left side: Secondary | ICD-10-CM

## 2019-06-14 MED ORDER — PREDNISONE 20 MG PO TABS: 40.0000 mg | ORAL_TABLET | Freq: Every day | ORAL | 0 refills | Status: DC

## 2019-06-14 MED ORDER — KETOROLAC TROMETHAMINE 60 MG/2ML IM SOLN
60.0000 mg | Freq: Once | INTRAMUSCULAR | Status: AC
  Administered 2019-06-14: 14:00:00 60 mg via INTRAMUSCULAR

## 2019-06-14 NOTE — Patient Instructions (Addendum)
I think you have sciatica. You have irritated a big nerve in your back and it causes the symptoms. Im going to put you on a steroid burst that you will take 2 pills/day for 5 days, a toradol shot today (high dose NSAID). 6 hours after your shot you can take motrin 600-800mg  with food as needed three times a day. Start these exercises. If not better, we will need to send to PT and consider imaging.   Dr. Artis Flock   Sciatica Rehab Ask your health care provider which exercises are safe for you. Do exercises exactly as told by your health care provider and adjust them as directed. It is normal to feel mild stretching, pulling, tightness, or discomfort as you do these exercises. Stop right away if you feel sudden pain or your pain gets worse. Do not begin these exercises until told by your health care provider. Stretching and range-of-motion exercises These exercises warm up your muscles and joints and improve the movement and flexibility of your hips and back. These exercises also help to relieve pain, numbness, and tingling. Sciatic nerve glide 1. Sit in a chair with your head facing down toward your chest. Place your hands behind your back. Let your shoulders slump forward. 2. Slowly straighten one of your legs while you tilt your head back as if you are looking toward the ceiling. Only straighten your leg as far as you can without making your symptoms worse. 3. Hold this position for __________ seconds. 4. Slowly return to the starting position. 5. Repeat with your other leg. Repeat __________ times. Complete this exercise __________ times a day. Knee to chest with hip adduction and internal rotation  1. Lie on your back on a firm surface with both legs straight. 2. Bend one of your knees and move it up toward your chest until you feel a gentle stretch in your lower back and buttock. Then, move your knee toward the shoulder that is on the opposite side from your leg. This is hip adduction and internal  rotation. ? Hold your leg in this position by holding on to the front of your knee. 3. Hold this position for __________ seconds. 4. Slowly return to the starting position. 5. Repeat with your other leg. Repeat __________ times. Complete this exercise __________ times a day. Prone extension on elbows  1. Lie on your abdomen on a firm surface. A bed may be too soft for this exercise. 2. Prop yourself up on your elbows. 3. Use your arms to help lift your chest up until you feel a gentle stretch in your abdomen and your lower back. ? This will place some of your body weight on your elbows. If this is uncomfortable, try stacking pillows under your chest. ? Your hips should stay down, against the surface that you are lying on. Keep your hip and back muscles relaxed. 4. Hold this position for __________ seconds. 5. Slowly relax your upper body and return to the starting position. Repeat __________ times. Complete this exercise __________ times a day. Strengthening exercises These exercises build strength and endurance in your back. Endurance is the ability to use your muscles for a long time, even after they get tired. Pelvic tilt This exercise strengthens the muscles that lie deep in the abdomen. 1. Lie on your back on a firm surface. Bend your knees and keep your feet flat on the floor. 2. Tense your abdominal muscles. Tip your pelvis up toward the ceiling and flatten your lower back into  the floor. ? To help with this exercise, you may place a small towel under your lower back and try to push your back into the towel. 3. Hold this position for __________ seconds. 4. Let your muscles relax completely before you repeat this exercise. Repeat __________ times. Complete this exercise __________ times a day. Alternating arm and leg raises  1. Get on your hands and knees on a firm surface. If you are on a hard floor, you may want to use padding, such as an exercise mat, to cushion your  knees. 2. Line up your arms and legs. Your hands should be directly below your shoulders, and your knees should be directly below your hips. 3. Lift your left leg behind you. At the same time, raise your right arm and straighten it in front of you. ? Do not lift your leg higher than your hip. ? Do not lift your arm higher than your shoulder. ? Keep your abdominal and back muscles tight. ? Keep your hips facing the ground. ? Do not arch your back. ? Keep your balance carefully, and do not hold your breath. 4. Hold this position for __________ seconds. 5. Slowly return to the starting position. 6. Repeat with your right leg and your left arm. Repeat __________ times. Complete this exercise __________ times a day. Posture and body mechanics Good posture and healthy body mechanics can help to relieve stress in your body's tissues and joints. Body mechanics refers to the movements and positions of your body while you do your daily activities. Posture is part of body mechanics. Good posture means:  Your spine is in its natural S-curve position (neutral).  Your shoulders are pulled back slightly.  Your head is not tipped forward. Follow these guidelines to improve your posture and body mechanics in your everyday activities. Standing   When standing, keep your spine neutral and your feet about hip width apart. Keep a slight bend in your knees. Your ears, shoulders, and hips should line up.  When you do a task in which you stand in one place for a long time, place one foot up on a stable object that is 2-4 inches (5-10 cm) high, such as a footstool. This helps keep your spine neutral. Sitting   When sitting, keep your spine neutral and keep your feet flat on the floor. Use a footrest, if necessary, and keep your thighs parallel to the floor. Avoid rounding your shoulders, and avoid tilting your head forward.  When working at a desk or a computer, keep your desk at a height where your hands  are slightly lower than your elbows. Slide your chair under your desk so you are close enough to maintain good posture.  When working at a computer, place your monitor at a height where you are looking straight ahead and you do not have to tilt your head forward or downward to look at the screen. Resting  When lying down and resting, avoid positions that are most painful for you.  If you have pain with activities such as sitting, bending, stooping, or squatting, lie in a position in which your body does not bend very much. For example, avoid curling up on your side with your arms and knees near your chest (fetal position).  If you have pain with activities such as standing for a long time or reaching with your arms, lie with your spine in a neutral position and bend your knees slightly. Try the following positions: ? Lying on your  side with a pillow between your knees. ? Lying on your back with a pillow under your knees. Lifting   When lifting objects, keep your feet at least shoulder width apart and tighten your abdominal muscles.  Bend your knees and hips and keep your spine neutral. It is important to lift using the strength of your legs, not your back. Do not lock your knees straight out.  Always ask for help to lift heavy or awkward objects. This information is not intended to replace advice given to you by your health care provider. Make sure you discuss any questions you have with your health care provider. Document Revised: 08/03/2018 Document Reviewed: 05/03/2018 Elsevier Patient Education  [place]. Sciatica  Sciatica is pain, weakness, tingling, or loss of feeling (numbness) along the sciatic nerve. The sciatic nerve starts in the lower back and goes down the back of each leg. Sciatica usually goes away on its own or with treatment. Sometimes, sciatica may come back (recur). What are the causes? This condition happens when the sciatic nerve is pinched or has pressure  put on it. This may be the result of:  A disk in between the bones of the spine bulging out too far (herniated disk).  Changes in the spinal disks that occur with aging.  A condition that affects a muscle in the butt.  Extra bone growth near the sciatic nerve.  A break (fracture) of the area between your hip bones (pelvis).  Pregnancy.  Tumor. This is rare. What increases the risk? You are more likely to develop this condition if you:  Play sports that put pressure or stress on the spine.  Have poor strength and ease of movement (flexibility).  Have had a back injury in the past.  Have had back surgery.  Sit for long periods of time.  Do activities that involve bending or lifting over and over again.  Are very overweight (obese). What are the signs or symptoms? Symptoms can vary from mild to very bad. They may include:  Any of these problems in the lower back, leg, hip, or butt: ? Mild tingling, loss of feeling, or dull aches. ? Burning sensations. ? Sharp pains.  Loss of feeling in the back of the calf or the sole of the foot.  Leg weakness.  Very bad back pain that makes it hard to move. These symptoms may get worse when you cough, sneeze, or laugh. They may also get worse when you sit or stand for long periods of time. How is this treated? This condition often gets better without any treatment. However, treatment may include:  Changing or cutting back on physical activity when you have pain.  Doing exercises and stretching.  Putting ice or heat on the affected area.  Medicines that help: ? To relieve pain and swelling. ? To relax your muscles.  Shots (injections) of medicines that help to relieve pain, irritation, and swelling.  Surgery. Follow these instructions at home: Medicines  Take over-the-counter and prescription medicines only as told by your doctor.  Ask your doctor if the medicine prescribed to you: ? Requires you to avoid driving or  using heavy machinery. ? Can cause trouble pooping (constipation). You may need to take these steps to prevent or treat trouble pooping:  Drink enough fluids to keep your pee (urine) pale yellow.  Take over-the-counter or prescription medicines.  Eat foods that are high in fiber. These include beans, whole grains, and fresh fruits and vegetables.  Limit foods  that are high in fat and sugar. These include fried or sweet foods. Managing pain      If told, put ice on the affected area. ? Put ice in a plastic bag. ? Place a towel between your skin and the bag. ? Leave the ice on for 20 minutes, 2-3 times a day.  If told, put heat on the affected area. Use the heat source that your doctor tells you to use, such as a moist heat pack or a heating pad. ? Place a towel between your skin and the heat source. ? Leave the heat on for 20-30 minutes. ? Remove the heat if your skin turns bright red. This is very important if you are unable to feel pain, heat, or cold. You may have a greater risk of getting burned. Activity   Return to your normal activities as told by your doctor. Ask your doctor what activities are safe for you.  Avoid activities that make your symptoms worse.  Take short rests during the day. ? When you rest for a long time, do some physical activity or stretching between periods of rest. ? Avoid sitting for a long time without moving. Get up and move around at least one time each hour.  Exercise and stretch regularly, as told by your doctor.  Do not lift anything that is heavier than 10 lb (4.5 kg) while you have symptoms of sciatica. ? Avoid lifting heavy things even when you do not have symptoms. ? Avoid lifting heavy things over and over.  When you lift objects, always lift in a way that is safe for your body. To do this, you should: ? Bend your knees. ? Keep the object close to your body. ? Avoid twisting. General instructions  Stay at a healthy weight.  Wear  comfortable shoes that support your feet. Avoid wearing high heels.  Avoid sleeping on a mattress that is too soft or too hard. You might have less pain if you sleep on a mattress that is firm enough to support your back.  Keep all follow-up visits as told by your doctor. This is important. Contact a doctor if:  You have pain that: ? Wakes you up when you are sleeping. ? Gets worse when you lie down. ? Is worse than the pain you have had in the past. ? Lasts longer than 4 weeks.  You lose weight without trying. Get help right away if:  You cannot control when you pee (urinate) or poop (have a bowel movement).  You have weakness in any of these areas and it gets worse: ? Lower back. ? The area between your hip bones. ? Butt. ? Legs.  You have redness or swelling of your back.  You have a burning feeling when you pee. Summary  Sciatica is pain, weakness, tingling, or loss of feeling (numbness) along the sciatic nerve.  This condition happens when the sciatic nerve is pinched or has pressure put on it.  Sciatica can cause pain, tingling, or loss of feeling (numbness) in the lower back, legs, hips, and butt.  Treatment often includes rest, exercise, medicines, and putting ice or heat on the affected area. This information is not intended to replace advice given to you by your health care provider. Make sure you discuss any questions you have with your health care provider. Document Revised: 04/30/2018 Document Reviewed: 04/30/2018 Elsevier Patient Education  2020 [employer].

## 2019-06-14 NOTE — Progress Notes (Signed)
Patient: Katherine Sanford MRN: [id number] DOB: [birth year] PCP: Orma Flaming, MD     Subjective:  Chief Complaint  Patient presents with  . Leg Pain  . Back Pain    HPI: The patient is a 54 y.o. female who presents today for lower back pain, that travels down to the left leg. She states pain started on Saturday night. She denies any precipitating factors including exercise, heavy lifting, random stanindg or positional changes. She states the pain is on her left lower back and radiates down her left lower leg down into her popliteal fossa. She states it is burning down her leg and is starting to be on the front of her leg as well. She denies any weakness, foot drop. Pain rated as a 8/10 and is intermittnet in nature. It comes in bursts and will last for 1 minutes and if she repositions it will sometime help the pain. Happening a few times an hour. She has taken ibuprofen with some relief. Sitting for long periods of time makes it worse. No urinary incontinence, no saddle parasethiasa. She states it feels numb at times over her left leg. No gait abnormality, no foot drop. No fever/chills.   Review of Systems  Constitutional: Negative for chills, fatigue and fever.  HENT: Negative for sinus pressure, sinus pain and sore throat.   Respiratory: Negative for cough, choking and shortness of breath.   Cardiovascular: Negative for chest pain, palpitations and leg swelling.  Gastrointestinal: Negative for nausea and vomiting.  Genitourinary: Negative for flank pain, pelvic pain and urgency.  Musculoskeletal: Positive for back pain. Negative for gait problem and joint swelling.  Neurological: Positive for numbness (burning down leg ). Negative for dizziness and light-headedness.    Allergies Patient has No Known Allergies.  Past Medical History Patient  has a past medical history of GERD (gastroesophageal reflux disease) and Migraines.  Surgical History Patient  has a past surgical  history that includes Cesarean section (2004) and Hemorrhoidectomy with hemorrhoid banding (1999).  Family History Pateint's family history includes Alcohol abuse in her father; Diabetes in her sister; Heart attack in her father; Learning disabilities in her father; Stroke in her father.  Social History Patient  reports that she has never smoked. She has never used smokeless tobacco. She reports previous alcohol use. She reports that she does not use drugs.    Objective: Vitals:   06/14/19 1340  BP: 100/70  Pulse: 79  Temp: 97.6 F (36.4 C)  TempSrc: Temporal  SpO2: 99%  Weight: 156 lb 3.2 oz (70.9 kg)  Height: 5\' 7"  (1.702 m)    Body mass index is 24.46 kg/m.  Physical Exam Vitals reviewed.  Constitutional:      Appearance: Normal appearance. She is normal weight.  HENT:     Head: Normocephalic and atraumatic.  Pulmonary:     Effort: Pulmonary effort is normal.  Musculoskeletal:     Comments: Negative straight leg test bilaterally   Neurological:     General: No focal deficit present.     Mental Status: She is alert and oriented to person, place, and time.     Sensory: No sensory deficit (sensation intact over left leg to pinprick sensation).     Motor: No weakness.     Gait: Gait normal.     Deep Tendon Reflexes: Reflexes normal.  Psychiatric:        Mood and Affect: Mood normal.        Behavior: Behavior normal.  Assessment/plan: 1. Sciatica of left side No red flags on exam. Conservative treatment at this time. Stretching exercises given. toradol shot in office, steroid burst and continued NSAIDs. If not better in one week, would image and send to PT. Discussed if not getting better MRI would need to be ordered. precautions given for saddle parasthesia, foot drop, urinary incontinence, etc. To go to ER.  - ketorolac (TORADOL) injection 60 mg  Total time of encounter: 25 minutes total time of encounter, including 22 minutes spent in face-to-face patient  care. This time includes coordination of care and counseling regarding acute issue, pathophysiology, treatment and plan of care. Remainder of non-face-to-face time involved reviewing chart documents/testing relevant to the patient encounter and documentation in the medical record.  This visit occurred during the SARS-CoV-2 public health emergency.  Safety protocols were in place, including screening questions prior to the visit, additional usage of staff PPE, and extensive cleaning of exam room while observing appropriate contact time as indicated for disinfecting solutions.     Return if symptoms worsen or fail to improve.   Orland Mustard, MD [facility]   06/14/2019

## 2019-06-14 NOTE — Telephone Encounter (Signed)
Reason for Call Symptomatic / Request for Health Information Initial Comment Having sharp pain in leg for a week, hurts when she walks. Translation No Nurse Assessment Nurse: Ala Dach, RN, Misty Stanley Date/Time Katherine Sanford Time): 06/14/2019 10:39:12 AM Confirm and document reason for call. If symptomatic, describe symptoms. ---Caller states left leg pain starts in mid -lower back radiates down to the knee started about 1 week ago. Pain level 8/10 states has taken ibuprofen and tylenol help for a bit but does not work for long Has the patient had close contact with a person known or suspected to have the novel coronavirus illness OR traveled / lives in area with major community spread (including international travel) in the last 14 days from the onset of symptoms? * If Asymptomatic, screen for exposure and travel within the last 14 days. ---No Does the patient have any new or worsening symptoms? ---Yes Will a triage be completed? ---Yes Related visit to physician within the last 2 weeks? ---No Does the PT have any chronic conditions? (i.e. diabetes, asthma, this includes High risk factors for pregnancy, etc.) ---No Is this a behavioral health or substance abuse call? ---No Guidelines Guideline Title Affirmed Question Affirmed Notes Nurse Date/Time Katherine Sanford Time) Leg Pain [1] SEVERE pain (e.g., excruciating, unable to do any normal activities) AND [2] not improved after 2 hours of pain medicine Balinda Quails 06/14/2019 10:43:00 AM PLEASE NOTE: All timestamps contained within this report are represented as [place] Standard Time. CONFIDENTIALTY NOTICE: This fax transmission is intended only for the addressee. It contains information that is legally privileged, confidential or otherwise protected from use or disclosure. If you are not the intended recipient, you are strictly prohibited from reviewing, disclosing, copying using or disseminating any of this information or taking any action in  reliance on or regarding this information. If you have received this fax in error, please notify us immediately by telephone so that we can arrange for its return to [place]. Phone: [phone], Toll-Free: [phone], Fax: [phone] Page: 2 of 2 Call Id: [id number] Disp. Time Katherine Sanford Time) Disposition Final User 06/14/2019 10:49:00 AM See HCP within 4 Hours (or PCP triage)

## 2019-08-23 ENCOUNTER — Other Ambulatory Visit: Payer: Self-pay | Admitting: Family Medicine

## 2019-08-23 DIAGNOSIS — Z1231 Encounter for screening mammogram for malignant neoplasm of breast: Secondary | ICD-10-CM

## 2019-09-06 ENCOUNTER — Ambulatory Visit: Payer: 59

## 2019-09-11 ENCOUNTER — Other Ambulatory Visit: Payer: Self-pay

## 2019-09-11 ENCOUNTER — Ambulatory Visit
Admission: RE | Admit: 2019-09-11 | Discharge: 2019-09-11 | Disposition: A | Discharge status: Home / Self Care | Payer: 59 | Source: Ambulatory Visit | Attending: Family Medicine | Admitting: Family Medicine

## 2019-09-11 DIAGNOSIS — Z1231 Encounter for screening mammogram for malignant neoplasm of breast: Secondary | ICD-10-CM

## 2020-10-12 IMAGING — MG DIGITAL SCREENING BILAT W/ TOMO W/ CAD
8 series · 8 of 24 positions shown · non-contrast
Comparison: None.

CLINICAL DATA: Screening.

EXAM:
DIGITAL SCREENING BILATERAL MAMMOGRAM WITH TOMO AND CAD

[L MLO synth-2D]
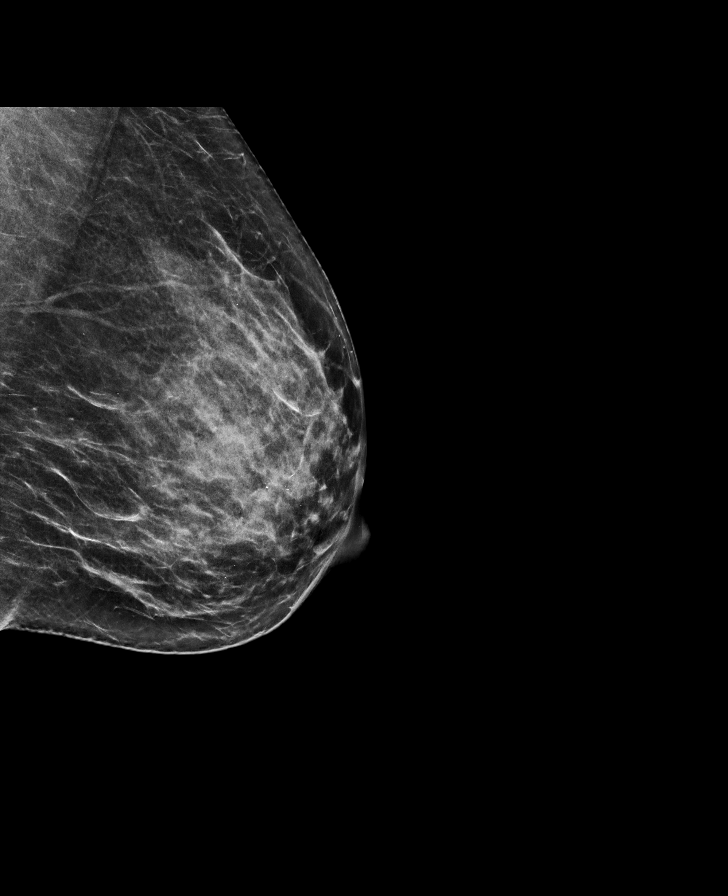

[L CC synth-2D]
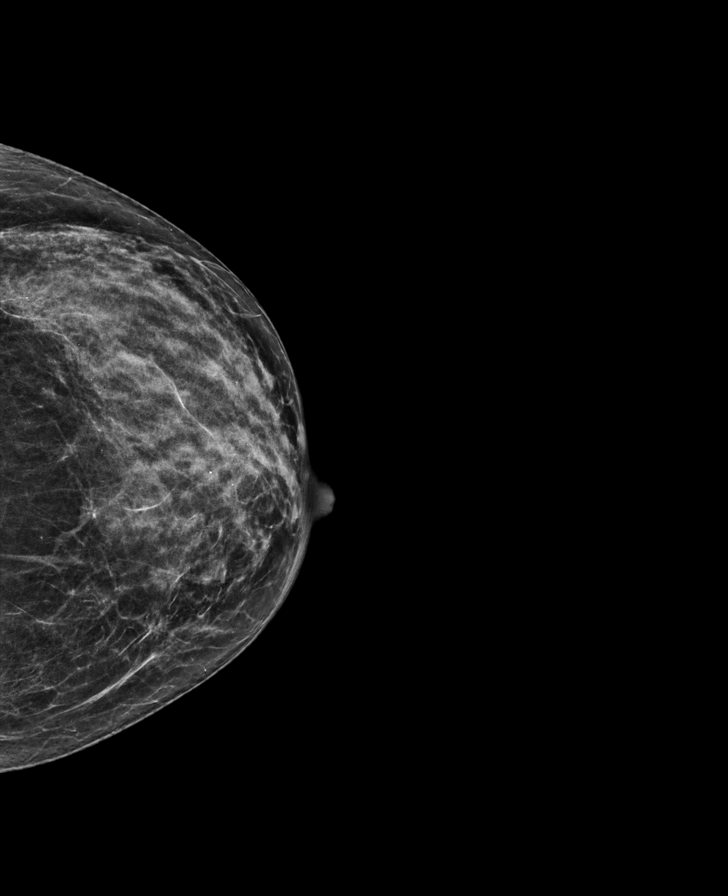

[R CC synth-2D]
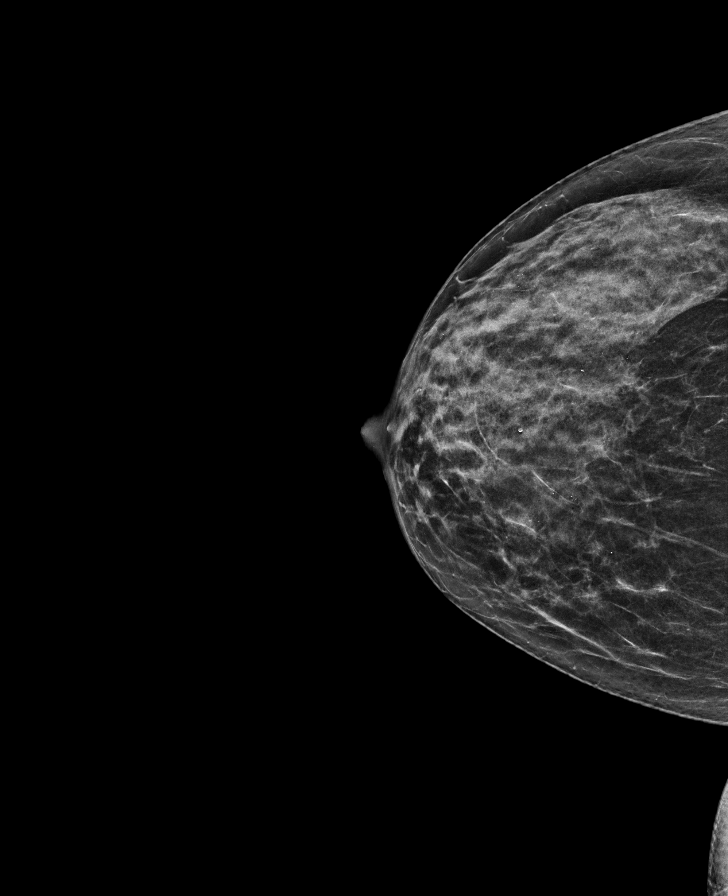

[R MLO synth-2D]
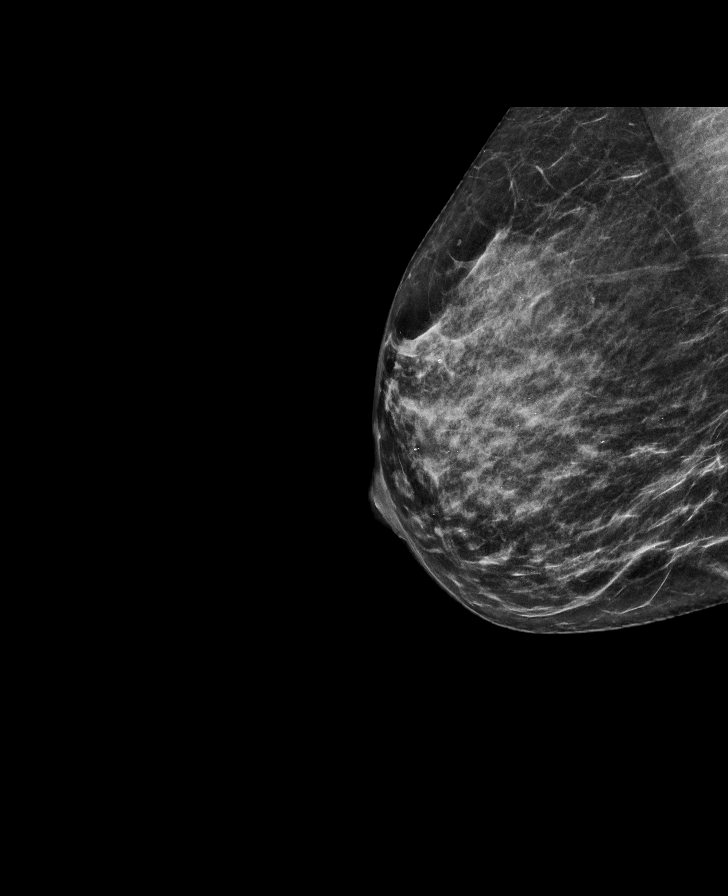

[R MLO tomo · tomo slice 30/59.0]
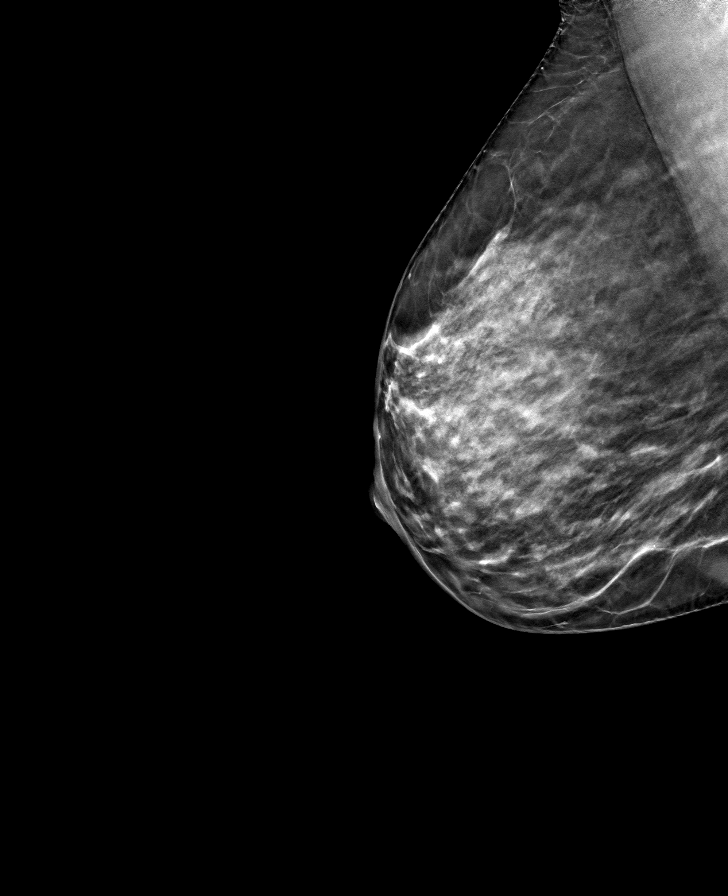

[L MLO tomo · tomo slice 33/65.0]
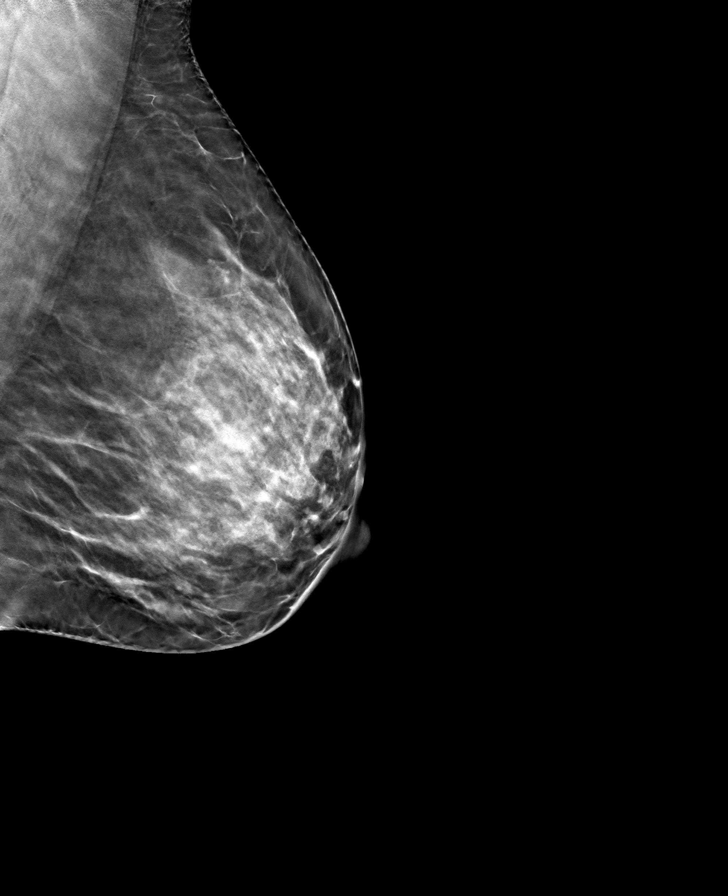

[R CC tomo · tomo slice 29/56.0]
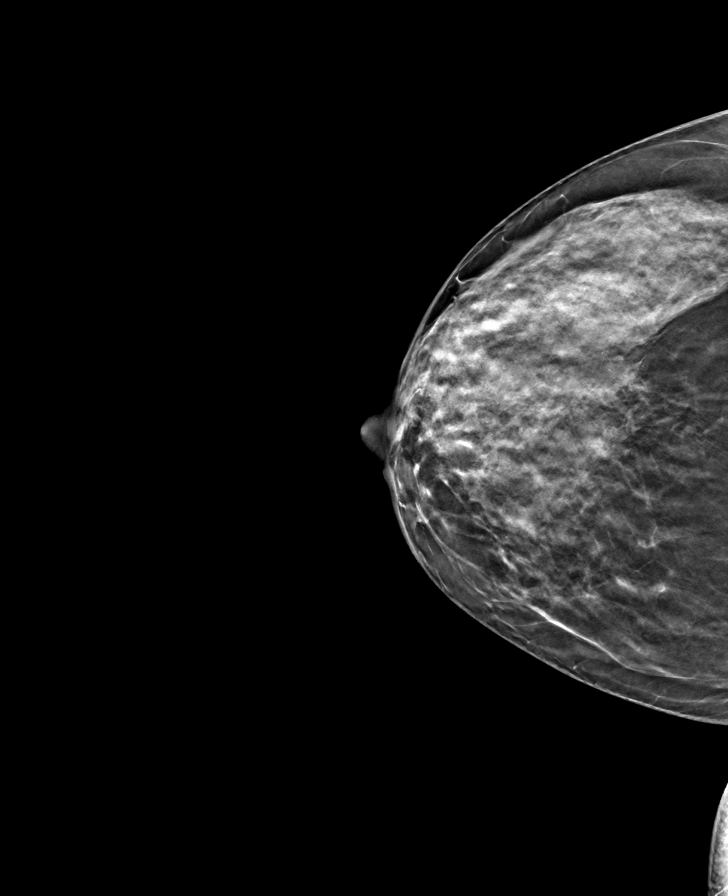

[L CC tomo · tomo slice 29/57.0]
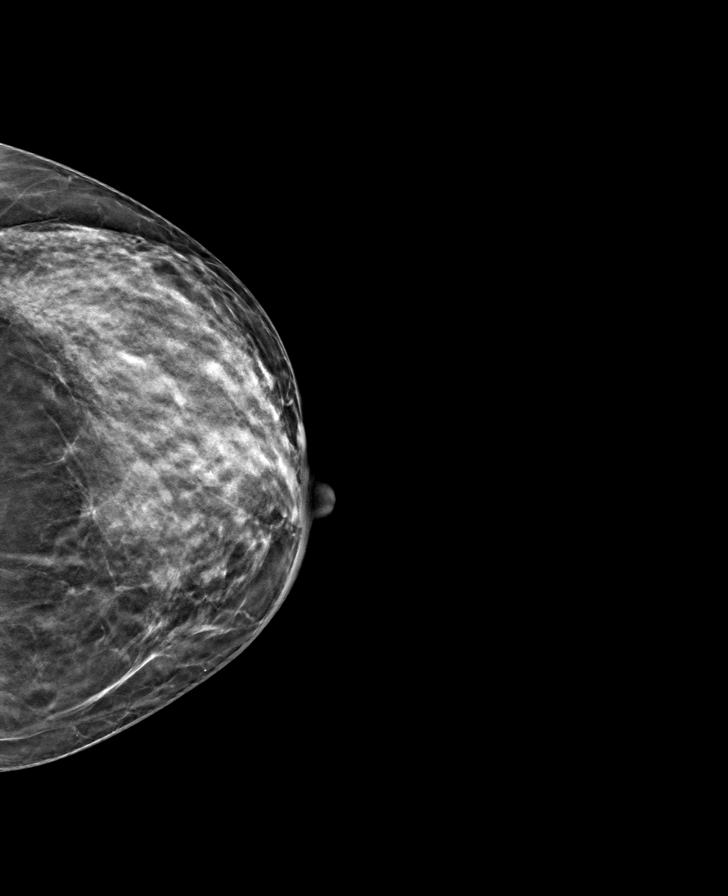

[8 of 24 positions shown; findings below may reference images not displayed]

ACR Breast Density Category c: The breast tissue is heterogeneously
dense, which may obscure small masses
FINDINGS: There are no findings suspicious for malignancy. Images were
processed with CAD.
IMPRESSION: No mammographic evidence of malignancy. A result letter of this
screening mammogram will be mailed directly to the patient.

RECOMMENDATION:
Screening mammogram in one year. (Code:EM-[id number])

BI-RADS CATEGORY  1: Negative.

## 2020-10-30 ENCOUNTER — Encounter: Payer: Self-pay | Admitting: Physician Assistant

## 2020-10-30 ENCOUNTER — Ambulatory Visit (INDEPENDENT_AMBULATORY_CARE_PROVIDER_SITE_OTHER): Payer: 59 | Admitting: Physician Assistant

## 2020-10-30 DIAGNOSIS — H6692 Otitis media, unspecified, left ear: Secondary | ICD-10-CM

## 2020-10-30 MED ORDER — AMOXICILLIN 500 MG PO CAPS: 1000.0000 mg | ORAL_CAPSULE | Freq: Three times a day (TID) | ORAL | 0 refills | Status: AC

## 2020-10-30 NOTE — Progress Notes (Signed)
Virtual Visit via Telephone Note  I connected with Katherine Sanford on 10/30/20 at  8:30 AM EDT by telephone and verified that I am speaking with the correct person using two identifiers.  Location: Patient: home Provider: Nature conservation officer at [employer]  I discussed the limitations, risks, security and privacy concerns of performing an evaluation and management service by telephone and the availability of in person appointments. I also discussed with the patient that there may be a patient responsible charge related to this service. The patient expressed understanding and agreed to proceed.   History of Present Illness: Left ear pain, feels very sensitive, started about two days ago. No drainage. Ear does not appear swollen. It is not tender to pull on the ear or push on the cartilage. Also complains of headache and sore throat. Slight congestion. Tried peroxide in the ear, which did help some. Does not normally get ear infections. [place] says she didn't go underwater. Slight fever at beginning of illness, but none now. No sick contacts.   Observations/Objective: Speaking clearly in full sentences without pauses or gasps. Alert and oriented.  Assessment and Plan: 1. Acute left otitis media Discussed with pt this sounds c/w with left otitis media. Will start on amoxicillin 500 mg two capsules TID x 5 days. She can also use Tylenol and Ibuprofen or heating pad for pain. Urgent care this weekend if worse or no improvement of symptoms.   Follow Up Instructions:    I discussed the assessment and treatment plan with the patient. The patient was provided an opportunity to ask questions and all were answered. The patient agreed with the plan and demonstrated an understanding of the instructions.   The patient was advised to call back or seek an in-person evaluation if the symptoms worsen or if the condition fails to improve as anticipated.  I  provided 7 minutes 16 seconds of non-face-to-face time during this encounter.   Idus Rathke M Eulas Schweitzer, PA-C

## 2020-10-30 NOTE — Patient Instructions (Signed)
Take amoxicillin as directed. Urgent care this weekend if acutely worse.

## 2021-10-10 ENCOUNTER — Telehealth: Payer: Self-pay | Admitting: Family Medicine

## 2021-10-10 NOTE — Telephone Encounter (Signed)
Pt asked to establish with me. I see her husband Irean Hong. Ok to schedule in open 30 min slot. Thanks.

## 2021-11-12 ENCOUNTER — Ambulatory Visit (INDEPENDENT_AMBULATORY_CARE_PROVIDER_SITE_OTHER): Payer: 59 | Admitting: Family Medicine

## 2021-11-12 ENCOUNTER — Encounter: Payer: Self-pay | Admitting: Family Medicine

## 2021-11-12 VITALS — BP 118/72 | HR 66 | Temp 98.3°F | Ht 66.75 in | Wt 154.2 lb

## 2021-11-12 DIAGNOSIS — E559 Vitamin D deficiency, unspecified: Secondary | ICD-10-CM | POA: Diagnosis not present

## 2021-11-12 DIAGNOSIS — G43109 Migraine with aura, not intractable, without status migrainosus: Secondary | ICD-10-CM

## 2021-11-12 DIAGNOSIS — R12 Heartburn: Secondary | ICD-10-CM | POA: Diagnosis not present

## 2021-11-12 DIAGNOSIS — Z131 Encounter for screening for diabetes mellitus: Secondary | ICD-10-CM

## 2021-11-12 DIAGNOSIS — Z13220 Encounter for screening for lipoid disorders: Secondary | ICD-10-CM

## 2021-11-12 DIAGNOSIS — Z01419 Encounter for gynecological examination (general) (routine) without abnormal findings: Secondary | ICD-10-CM

## 2021-11-12 NOTE — Patient Instructions (Addendum)
Call to schedule mammogram at your convenience: [facility] [phone] Puede pasar para laboratorios en [address] a [place] in [place] en frente [address] [facility] (basement) [address]  Regresar en 1 ao para fisico.  La remitiremos a [employer].  Gusto verla hoy.

## 2021-11-12 NOTE — Progress Notes (Unsigned)
Patient ID: Mancel Bale, female    DOB: 12/20/1965, 56 y.o.   MRN: [id number]  This visit was conducted in person.  BP 118/72   Pulse 66   Temp 98.3 F (36.8 C) (Temporal)   Ht 5' 6.75" (1.695 m)   Wt 154 lb 4 oz (70 kg)   LMP  (LMP Unknown) Comment: LMP 2014  SpO2 98%   BMI 24.34 kg/m    CC: new pt to establish Subjective:   HPI: Katherine Sanford is a 56 y.o. female presenting on 11/12/2021 for New Patient (Initial Visit)   Previously saw [facility] but PCP left office. I see pt's husband [place].   H/o vit D def - she does supplement in winter months but otherwise not regularly.   Heartburn - manages with diet.   Migraines - occasional, worse in summers trigger seems to be sun exposure therefore wears sunglasses. No migraine in the past year. + photo/phonophobia, nausea, activity limiting, + auras. Manages with caffeine and ibuprofen.   Last CPE - 2020 Last well woman exam - ~2015 in [place].  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 Interim medical history since our last visit reviewed. Allergies and medications reviewed and updated. No outpatient medications prior to visit.   No facility-administered medications prior to visit.     Per HPI unless specifically indicated in ROS section below Review of Systems  Objective:  BP 118/72   Pulse 66   Temp 98.3 F (36.8 C) (Temporal)   Ht 5' 6.75" (1.695 m)   Wt 154 lb 4 oz (70 kg)   LMP  (LMP Unknown) Comment: LMP 2014  SpO2 98%   BMI 24.34 kg/m   Wt Readings from Last 3 Encounters:  11/12/21 154 lb 4 oz  (70 kg)  06/14/19 156 lb 3.2 oz (70.9 kg)  04/03/19 157 lb 6.4 oz (71.4 kg)      Physical Exam Vitals and nursing note reviewed.  Constitutional:      Appearance: Normal appearance. She is not ill-appearing.  HENT:     Head: Normocephalic and atraumatic.     Mouth/Throat:     Mouth: Mucous membranes are moist.     Pharynx: Oropharynx is clear. No oropharyngeal exudate or posterior oropharyngeal erythema.  Eyes:     Conjunctiva/sclera: Conjunctivae normal.     Pupils: Pupils are equal, round, and reactive to light.  Neck:     Thyroid: No thyroid mass or thyromegaly.  Cardiovascular:     Rate and Rhythm: Normal rate and regular rhythm.     Pulses: Normal pulses.     Heart sounds: Normal heart sounds. No murmur heard. Pulmonary:     Effort: Pulmonary effort is normal. No respiratory distress.     Breath sounds: Normal breath sounds. No wheezing, rhonchi or rales.  Musculoskeletal:     Cervical back: Normal range of motion and neck supple.     Right lower leg: No edema.     Left lower leg: No edema.  Lymphadenopathy:     Cervical: No cervical adenopathy.  Skin:    General: Skin is warm and  dry.     Findings: No rash.  Neurological:     General: No focal deficit present.     Mental Status: She is alert.  Psychiatric:        Mood and Affect: Mood normal.        Behavior: Behavior normal.       Results for orders placed or performed in visit on 04/03/19  CBC with Differential/Platelet  Result Value Ref Range   WBC 3.8 (L) 4.0 - 10.5 K/uL   RBC 4.22 3.87 - 5.11 Mil/uL   Hemoglobin 12.1 12.0 - 15.0 g/dL   HCT 16.1 09.6 - 04.5 %   MCV 87.3 78.0 - 100.0 fl   MCHC 33.0 30.0 - 36.0 g/dL   RDW 40.9 81.1 - 91.4 %   Platelets 311.0 150.0 - 400.0 K/uL   Neutrophils Relative % 52.8 43.0 - 77.0 %   Lymphocytes Relative 32.8 12.0 - 46.0 %   Monocytes Relative 10.9 3.0 - 12.0 %   Eosinophils Relative 2.5 0.0 - 5.0 %   Basophils Relative 1.0 0.0 - 3.0 %   Neutro Abs 2.0 1.4 -  7.7 K/uL   Lymphs Abs 1.3 0.7 - 4.0 K/uL   Monocytes Absolute 0.4 0.1 - 1.0 K/uL   Eosinophils Absolute 0.1 0.0 - 0.7 K/uL   Basophils Absolute 0.0 0.0 - 0.1 K/uL  Comprehensive metabolic panel  Result Value Ref Range   Sodium 138 135 - 145 mEq/L   Potassium 4.3 3.5 - 5.1 mEq/L   Chloride 104 96 - 112 mEq/L   CO2 29 19 - 32 mEq/L   Glucose, Bld 104 (H) 70 - 99 mg/dL   BUN 16 6 - 23 mg/dL   Creatinine, Ser 7.82 0.40 - 1.20 mg/dL   Total Bilirubin 0.3 0.2 - 1.2 mg/dL   Alkaline Phosphatase 60 39 - 117 U/L   AST 18 0 - 37 U/L   ALT 18 0 - 35 U/L   Total Protein 7.0 6.0 - 8.3 g/dL   Albumin 4.5 3.5 - 5.2 g/dL   GFR 95.62 >13.08 mL/min   Calcium 9.4 8.4 - 10.5 mg/dL  Lipid panel  Result Value Ref Range   Cholesterol 199 0 - 200 mg/dL   Triglycerides 65.7 0.0 - 149.0 mg/dL   HDL 84.69 >62.95 mg/dL   VLDL 28.4 0.0 - 13.2 mg/dL   LDL Cholesterol 440 (H) 0 - 99 mg/dL   Total CHOL/HDL Ratio 3    NonHDL 136.14   TSH  Result Value Ref Range   TSH 1.35 0.35 - 4.50 uIU/mL  Vitamin B12  Result Value Ref Range   Vitamin B-12 752 211 - 911 pg/mL  VITAMIN D 25 Hydroxy (Vit-D Deficiency, Fractures)  Result Value Ref Range   VITD 24.96 (L) 30.00 - 100.00 ng/mL  Hemoglobin A1c  Result Value Ref Range   Hgb A1c MFr Bld 5.6 4.6 - 6.5 %    Assessment & Plan:  Preventative health care issues reviewed.  Recommend return for fasting labs - may get them done at [facility] basement lab.  She will let me know who she saw in [place] for colonoscopy and we will request records.  Will refer to GYN in [place] as overdue for well woman exam.  Problem List Items Addressed This Visit     Vitamin D deficiency    Not regular with vit D replacement during summer months. Update levels next labs.       Relevant Orders  VITAMIN D 25 Hydroxy (Vit-D Deficiency, Fractures)   Migraine - Primary    Rare migraines, manages with PRN OTC medication. Discussed excedrin migraine as an option.        Heartburn    Well managed with diet.       Other Visit Diagnoses     Diabetes mellitus screening       Relevant Orders   Basic metabolic panel   Lipid screening       Relevant Orders   Lipid panel   Well woman exam       Relevant Orders   Ambulatory referral to Gynecology        No orders of the defined types were placed in this encounter.  Orders Placed This Encounter  Procedures   VITAMIN D 25 Hydroxy (Vit-D Deficiency, Fractures)    Standing Status:   Future    Standing Expiration Date:   11/15/2022   Lipid panel    Standing Status:   Future    Standing Expiration Date:   Q000111Q   Basic metabolic panel    Standing Status:   Future    Standing Expiration Date:   11/15/2022   Ambulatory referral to Gynecology    Referral Priority:   Routine    Referral Type:   Consultation    Referral Reason:   Specialty Services Required    Requested Specialty:   Gynecology    Number of Visits Requested:   1     Patient Instructions  Call to schedule mammogram at your convenience: [place] [phone] Puede pasar para laboratorios en ayunas a [place] in [place]) [place].  Regresar en 1 ao para fisico.  La remitiremos a Designer, jewellery.  Gusto verla hoy.   Follow up plan: Return if symptoms worsen or fail to improve.  Ria Bush, MD

## 2021-11-14 ENCOUNTER — Encounter: Payer: Self-pay | Admitting: Family Medicine

## 2021-11-14 DIAGNOSIS — G43909 Migraine, unspecified, not intractable, without status migrainosus: Secondary | ICD-10-CM | POA: Insufficient documentation

## 2021-11-14 DIAGNOSIS — R12 Heartburn: Secondary | ICD-10-CM | POA: Insufficient documentation

## 2021-11-14 NOTE — Assessment & Plan Note (Signed)
Rare migraines, manages with PRN OTC medication. Discussed excedrin migraine as an option.

## 2021-11-14 NOTE — Assessment & Plan Note (Signed)
Not regular with vit D replacement during summer months. Update levels next labs.

## 2021-11-14 NOTE — Assessment & Plan Note (Addendum)
Well managed with diet

## 2022-01-28 ENCOUNTER — Encounter: Payer: Self-pay | Admitting: Internal Medicine

## 2022-01-28 ENCOUNTER — Ambulatory Visit (INDEPENDENT_AMBULATORY_CARE_PROVIDER_SITE_OTHER): Payer: 59 | Admitting: Internal Medicine

## 2022-01-28 DIAGNOSIS — H66002 Acute suppurative otitis media without spontaneous rupture of ear drum, left ear: Secondary | ICD-10-CM

## 2022-01-28 DIAGNOSIS — H6692 Otitis media, unspecified, left ear: Secondary | ICD-10-CM | POA: Insufficient documentation

## 2022-01-28 MED ORDER — AMOXICILLIN 500 MG PO TABS: 1000.0000 mg | ORAL_TABLET | Freq: Two times a day (BID) | ORAL | 0 refills | Status: AC

## 2022-01-28 NOTE — Assessment & Plan Note (Signed)
Likely related to the air flight Will try amoxicillin 1000 bid x 1 week---change to augmentin if not effective Could consider prednisone also if fullness doesn't go away

## 2022-01-28 NOTE — Progress Notes (Signed)
   Subjective:    Patient ID: Katherine Sanford, female    DOB: 1966/04/01, 56 y.o.   MRN: [id number]  HPI Here due to respiratory infection  Having pain in left ear--started 4 days Congested entire left side--including sore throat only on that side Headache also--and feels congested Low grade fever first 2 days No chills or sweats Some dry cough--but no drainage  Tried tylenol--helped the headache some Natural remedies--ginger/tumeric---helped some  Didn't test for COVID---hasn't been vaccinated  1 bad left ear infection 3-4 years ago Recent trip to [place]--symptoms started after  No current outpatient medications on file prior to visit.   No current facility-administered medications on file prior to visit.    No Known Allergies  Past Medical History:  Diagnosis Date   Heartburn    History of chicken pox    Migraines    Shingles 2015    Past Surgical History:  Procedure Laterality Date   CESAREAN SECTION  2004   HEMORRHOIDECTOMY WITH HEMORRHOID BANDING  1999    Family History  Problem Relation Age of Onset   Alcohol abuse Father    Heart attack Father 74   Post-traumatic stress disorder Father    Diabetes Sister        prediabetes   Cancer Neg Hx     Social History   Socioeconomic History   Marital status: Married    Spouse name: Not on file   Number of children: Not on file   Years of education: Not on file   Highest education level: Not on file  Occupational History   Not on file  Tobacco Use   Smoking status: Never   Smokeless tobacco: Never  Vaping Use   Vaping Use: Never used  Substance and Sexual Activity   Alcohol use: Not Currently    Comment: socially   Drug use: Never   Sexual activity: Not Currently  Other Topics Concern   Not on file  Social History Narrative   Lives with husband [place] and 1 daughter (has 4)   Edu: Physicist, 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  HENT:     Head:     Comments: Mild left maxillary tenderness    Right Ear: Tympanic membrane and ear canal normal.     Left Ear: Ear canal normal.     Ears:     Comments: Left TM bulging superiorly and red    Nose: Congestion present.     Mouth/Throat:     Pharynx: No oropharyngeal exudate or posterior oropharyngeal erythema.  Pulmonary:     Effort: Pulmonary effort is normal.     Breath sounds: Normal breath sounds. No wheezing or rales.  Neurological:     Mental Status: She is alert.            Assessment & Plan:

## 2022-02-17 ENCOUNTER — Other Ambulatory Visit (INDEPENDENT_AMBULATORY_CARE_PROVIDER_SITE_OTHER): Payer: 59

## 2022-02-17 DIAGNOSIS — Z13220 Encounter for screening for lipoid disorders: Secondary | ICD-10-CM | POA: Diagnosis not present

## 2022-02-17 DIAGNOSIS — Z131 Encounter for screening for diabetes mellitus: Secondary | ICD-10-CM

## 2022-02-17 DIAGNOSIS — E559 Vitamin D deficiency, unspecified: Secondary | ICD-10-CM

## 2022-02-17 LAB — LIPID PANEL
Cholesterol: 182 mg/dL (ref 0–200)
HDL: 62.7 mg/dL (ref >39.00)
LDL Cholesterol: 109 mg/dL — ABNORMAL HIGH (ref 0–99)
NonHDL: 119.76
Total CHOL/HDL Ratio: 3
Triglycerides: 54 mg/dL (ref 0.0–149.0)
VLDL: 10.8 mg/dL (ref 0.0–40.0)

## 2022-02-17 LAB — BASIC METABOLIC PANEL
BUN: 10 mg/dL (ref 6–23)
CO2: 27 mEq/L (ref 19–32)
Calcium: 9.5 mg/dL (ref 8.4–10.5)
Chloride: 105 mEq/L (ref 96–112)
Creatinine, Ser: 0.71 mg/dL (ref 0.40–1.20)
GFR: 93.86 mL/min (ref >60.00)
Glucose, Bld: 89 mg/dL (ref 70–99)
Potassium: 3.7 mEq/L (ref 3.5–5.1)
Sodium: 140 mEq/L (ref 135–145)

## 2022-02-17 LAB — VITAMIN D 25 HYDROXY (VIT D DEFICIENCY, FRACTURES): VITD: 26 ng/mL — ABNORMAL LOW (ref 30.00–100.00)

## 2022-02-21 ENCOUNTER — Other Ambulatory Visit: Payer: Self-pay | Admitting: Family Medicine

## 2022-02-21 MED ORDER — VITAMIN D3 25 MCG (1000 UT) PO CAPS: 1.0000 | ORAL_CAPSULE | Freq: Every day | ORAL | Status: DC

## 2022-07-18 ENCOUNTER — Telehealth: Payer: Self-pay | Admitting: *Deleted

## 2022-07-18 NOTE — Telephone Encounter (Signed)
-----   Message from Ria Bush, MD sent at 07/17/2022 11:06 AM EDT ----- Can we add patient at 4:30pm on Tuesday for pruritic rash?  Thanks, Garlon Hatchet

## 2022-07-18 NOTE — Telephone Encounter (Signed)
Called and left a message on voicemail for patient to call the office back. See message from Dr. Danise Mina.

## 2022-07-18 NOTE — Telephone Encounter (Signed)
Patient added to Dr. Bosie Clos scheduled as instructed.

## 2022-07-18 NOTE — Telephone Encounter (Signed)
Patient returned call,she is okay with being seen at 430 tomorrow for her rash.Could she be added to his schedule please?

## 2022-07-19 ENCOUNTER — Encounter: Payer: Self-pay | Admitting: Family Medicine

## 2022-07-19 ENCOUNTER — Ambulatory Visit (INDEPENDENT_AMBULATORY_CARE_PROVIDER_SITE_OTHER): Payer: 59 | Admitting: Family Medicine

## 2022-07-19 VITALS — BP 112/64 | HR 69 | Temp 97.3°F | Ht 66.75 in | Wt 157.1 lb

## 2022-07-19 DIAGNOSIS — L282 Other prurigo: Secondary | ICD-10-CM | POA: Diagnosis not present

## 2022-07-19 DIAGNOSIS — Z872 Personal history of diseases of the skin and subcutaneous tissue: Secondary | ICD-10-CM | POA: Insufficient documentation

## 2022-07-19 MED ORDER — HYDROXYZINE HCL 25 MG PO TABS: 25.0000 mg | ORAL_TABLET | Freq: Three times a day (TID) | ORAL | 0 refills | Status: DC | PRN

## 2022-07-19 MED ORDER — PREDNISONE 20 MG PO TABS: ORAL_TABLET | ORAL | 0 refills | Status: DC

## 2022-07-19 NOTE — Assessment & Plan Note (Addendum)
Intensely pruritic centrifugal rash that started in trunk and is now spreading to extremities, sparing palms/soles/face. No systemic symptoms.  Unclear etiology, no known preceding exposure. ?folliculitis vs other.  Not consistent with hives, fungal inefction, scabies, tick borne illness, contact dermatitis, no h/o insect bite.  Given pruritus, Rx prednisone taper, hydroxyzine PRN itch, discussed calomine lotion and oatmeal bath.  If not improved with this, low threshold to further evaluate with blood work - ordered and reviewed indications to get testing done.  Update with effect.

## 2022-07-19 NOTE — Progress Notes (Signed)
Patient ID: Katherine Sanford, female    DOB: 04-23-1966, 57 y.o.   MRN: [id number]  This visit was conducted in person.  BP 112/64   Pulse 69   Temp (!) 97.3 F (36.3 C) (Temporal)   Ht 5' 6.75" (1.695 m)   Wt 157 lb 2 oz (71.3 kg)   LMP  (LMP Unknown) Comment: LMP 2014  SpO2 99%   BMI 24.79 kg/m    CC: itchy rash Subjective:   HPI: Zoà Leist is a 57 y.o. female presenting on 07/19/2022 for Rash (C/o itchy rash all over. Sxs started 07/15/22. )   5d h/o itchy rash throughout body started in torso and now has spread to extremities. No oral lesions.  No rash to palms or soles.  No fever/chills, nausea, abd pain, HA, joint pains.   No new detergent, soap, shampoo,  No new vitamins or supplements.  No new foods.  She is currently not taking any medication, vitamin, supplement.   Hasn't been working outdoors.  She regularly walks dog.  No tick bites.  No recent increased red meat intake (deer, cow, pork).   Has tried benadryl gel and orally with limited relief.      Relevant past medical, surgical, family and social history reviewed and updated as indicated. Interim medical history since our last visit reviewed. Allergies and medications reviewed and updated. Outpatient Medications Prior to Visit  Medication Sig Dispense Refill   Cholecalciferol (VITAMIN D3) 25 MCG (1000 UT) CAPS Take 1 capsule (1,000 Units total) by mouth daily. 30 capsule    No facility-administered medications prior to visit.     Per HPI unless specifically indicated in ROS section below Review of Systems  Objective:  BP 112/64   Pulse 69   Temp (!) 97.3 F (36.3 C) (Temporal)   Ht 5' 6.75" (1.695 m)   Wt 157 lb 2 oz (71.3 kg)   LMP  (LMP Unknown) Comment: LMP 2014  SpO2 99%   BMI 24.79 kg/m   Wt Readings from Last 3 Encounters:  07/19/22 157 lb 2 oz (71.3 kg)  01/28/22 156 lb (70.8 kg)  11/12/21 154 lb 4 oz (70 kg)      Physical Exam Vitals and nursing  note reviewed.  Constitutional:      Appearance: Normal appearance. She is not ill-appearing.  Musculoskeletal:     Right lower leg: No edema.     Left lower leg: No edema.  Skin:    General: Skin is warm.     Findings: Erythema and rash present.     Comments:  Centrifugal rash Older coalescing maculopapular rash to trunk - abd and back Markedly pruritic papules to bilateral upper extremities, less to lower extremities Rash spares palms and soles as well as face  Neurological:     Mental Status: She is alert.  Psychiatric:        Mood and Affect: Mood normal.        Behavior: Behavior normal.       Results for orders placed or performed in visit on Q000111Q  Basic metabolic panel  Result Value Ref Range   Sodium 140 135 - 145 mEq/L   Potassium 3.7 3.5 - 5.1 mEq/L   Chloride 105 96 - 112 mEq/L   CO2 27 19 - 32 mEq/L   Glucose, Bld 89 70 - 99 mg/dL   BUN 10 6 - 23 mg/dL   Creatinine, Ser 0.71 0.40 - 1.20 mg/dL   GFR 93.86 >  60.00 mL/min   Calcium 9.5 8.4 - 10.5 mg/dL  Lipid panel  Result Value Ref Range   Cholesterol 182 0 - 200 mg/dL   Triglycerides 54.0 0.0 - 149.0 mg/dL   HDL 62.70 >39.00 mg/dL   VLDL 10.8 0.0 - 40.0 mg/dL   LDL Cholesterol 109 (H) 0 - 99 mg/dL   Total CHOL/HDL Ratio 3    NonHDL 119.76   VITAMIN D 25 Hydroxy (Vit-D Deficiency, Fractures)  Result Value Ref Range   VITD 26.00 (L) 30.00 - 100.00 ng/mL    Assessment & Plan:   Problem List Items Addressed This Visit     Pruritic rash - Primary    Intensely pruritic centrifugal rash that started in trunk and is now spreading to extremities, sparing palms/soles/face. No systemic symptoms.  Unclear etiology, no known preceding exposure. ?folliculitis vs other.  Not consistent with hives, fungal inefction, scabies, tick borne illness, contact dermatitis, no h/o insect bite.  Given pruritus, Rx prednisone taper, hydroxyzine PRN itch, discussed calomine lotion and oatmeal bath.  If not improved with  this, low threshold to further evaluate with blood work - ordered and reviewed indications to get testing done.  Update with effect.       Relevant Orders   CBC with Differential/Platelet   Comprehensive metabolic panel   TSH   Sedimentation rate   ANA   Alpha-Gal Panel     Meds ordered this encounter  Medications   predniSONE (DELTASONE) 20 MG tablet    Sig: Take two tablets daily for 4 days followed by one tablet daily for 4 days    Dispense:  12 tablet    Refill:  0   hydrOXYzine (ATARAX) 25 MG tablet    Sig: Take 1 tablet (25 mg total) by mouth 3 (three) times daily as needed for itching (sedation precautions).    Dispense:  30 tablet    Refill:  0    Orders Placed This Encounter  Procedures   CBC with Differential/Platelet    Standing Status:   Future    Standing Expiration Date:   07/19/2023   Comprehensive metabolic panel    Standing Status:   Future    Standing Expiration Date:   07/19/2023   TSH    Standing Status:   Future    Standing Expiration Date:   07/19/2023   Sedimentation rate    Standing Status:   Future    Standing Expiration Date:   07/19/2023   ANA    Standing Status:   Future    Standing Expiration Date:   07/19/2023   Alpha-Gal Panel    Standing Status:   Future    Standing Expiration Date:   07/19/2023    Patient Instructions  Tome prednisona 20mg  2 pastillas por 4 dias y luego 1 pastilla por 3 dias. Puede usar hydroxyzina para picazon -25mg  dos veces al dia - esto puede causar sueo.  Si no mejora, pase por el sotano de [employer] office para laboratorios. Dejeme saber si no mejora con esto.  Gusto verla hoy!   Follow up plan: Return if symptoms worsen or fail to improve.  Ria Bush, MD

## 2022-07-19 NOTE — Patient Instructions (Addendum)
Tome prednisona 20mg  2 pastillas por 4 dias y luego 1 pastilla por 3 dias. Puede usar hydroxyzina para picazon -25mg  dos veces al dia - esto puede causar sueo.  Si no mejora, pase por el sotano de [employer] office para laboratorios. Dejeme saber si no mejora con esto.  Gusto verla hoy!

## 2022-07-25 ENCOUNTER — Telehealth: Payer: Self-pay | Admitting: Family Medicine

## 2022-07-25 ENCOUNTER — Other Ambulatory Visit (INDEPENDENT_AMBULATORY_CARE_PROVIDER_SITE_OTHER): Payer: 59

## 2022-07-25 DIAGNOSIS — L282 Other prurigo: Secondary | ICD-10-CM | POA: Diagnosis not present

## 2022-07-25 LAB — CBC WITH DIFFERENTIAL/PLATELET
Basophils Absolute: 0.1 10*3/uL (ref 0.0–0.1)
Basophils Relative: 1.2 % (ref 0.0–3.0)
Eosinophils Absolute: 0.2 10*3/uL (ref 0.0–0.7)
Eosinophils Relative: 2.9 % (ref 0.0–5.0)
HCT: 34.5 % — ABNORMAL LOW (ref 36.0–46.0)
Hemoglobin: 11.6 g/dL — ABNORMAL LOW (ref 12.0–15.0)
Lymphocytes Relative: 41.9 % (ref 12.0–46.0)
Lymphs Abs: 2.7 10*3/uL (ref 0.7–4.0)
MCHC: 33.8 g/dL (ref 30.0–36.0)
MCV: 88 fl (ref 78.0–100.0)
Monocytes Absolute: 0.5 10*3/uL (ref 0.1–1.0)
Monocytes Relative: 7.5 % (ref 3.0–12.0)
Neutro Abs: 3 10*3/uL (ref 1.4–7.7)
Neutrophils Relative %: 46.5 % (ref 43.0–77.0)
Platelets: 313 10*3/uL (ref 150.0–400.0)
RBC: 3.92 Mil/uL (ref 3.87–5.11)
RDW: 13.3 % (ref 11.5–15.5)
WBC: 6.5 10*3/uL (ref 4.0–10.5)

## 2022-07-25 LAB — COMPREHENSIVE METABOLIC PANEL
ALT: 15 U/L (ref 0–35)
AST: 13 U/L (ref 0–37)
Albumin: 4.3 g/dL (ref 3.5–5.2)
Alkaline Phosphatase: 48 U/L (ref 39–117)
BUN: 12 mg/dL (ref 6–23)
CO2: 29 mEq/L (ref 19–32)
Calcium: 9.2 mg/dL (ref 8.4–10.5)
Chloride: 104 mEq/L (ref 96–112)
Creatinine, Ser: 0.88 mg/dL (ref 0.40–1.20)
GFR: 72.32 mL/min (ref >60.00)
Glucose, Bld: 80 mg/dL (ref 70–99)
Potassium: 3.8 mEq/L (ref 3.5–5.1)
Sodium: 137 mEq/L (ref 135–145)
Total Bilirubin: 0.3 mg/dL (ref 0.2–1.2)
Total Protein: 6.7 g/dL (ref 6.0–8.3)

## 2022-07-25 LAB — TSH: TSH: 2.39 u[IU]/mL (ref 0.35–5.50)

## 2022-07-25 LAB — SEDIMENTATION RATE: Sed Rate: 4 mm/hr (ref 0–30)

## 2022-07-25 MED ORDER — PERMETHRIN 5 % EX CREA: 1.0000 | TOPICAL_CREAM | Freq: Once | CUTANEOUS | 0 refills | Status: AC

## 2022-07-25 NOTE — Telephone Encounter (Signed)
Plz call - if itchy rash is no better after steroid and hydroxyzine course, recommend she use permethrin cream to treat possible scabies or mite bites. I've sent this in to her pharmacy.

## 2022-07-25 NOTE — Telephone Encounter (Signed)
Left detailed message on cellphone, per dpr.  Advised of Dr. Synthia Innocent message and advised to call back with any questions.

## 2022-07-30 LAB — ALPHA-GAL PANEL
Allergen, Mutton, f88: <0.1 kU/L
Allergen, Pork, f26: <0.1 kU/L
Beef: <0.1 kU/L
CLASS: 0
CLASS: 0
Class: 0
GALACTOSE-ALPHA-1,3-GALACTOSE IGE*: <0.1 kU/L (ref <0.10)

## 2022-07-30 LAB — INTERPRETATION:

## 2022-07-30 LAB — ANA: Anti Nuclear Antibody (ANA): NEGATIVE

## 2022-07-31 ENCOUNTER — Encounter: Payer: Self-pay | Admitting: Family Medicine

## 2022-07-31 DIAGNOSIS — Z01419 Encounter for gynecological examination (general) (routine) without abnormal findings: Secondary | ICD-10-CM

## 2022-07-31 DIAGNOSIS — L282 Other prurigo: Secondary | ICD-10-CM

## 2022-07-31 DIAGNOSIS — Z1231 Encounter for screening mammogram for malignant neoplasm of breast: Secondary | ICD-10-CM

## 2022-08-02 MED ORDER — HYDROXYZINE HCL 25 MG PO TABS: 25.0000 mg | ORAL_TABLET | Freq: Three times a day (TID) | ORAL | 0 refills | Status: DC | PRN

## 2022-08-03 ENCOUNTER — Ambulatory Visit (INDEPENDENT_AMBULATORY_CARE_PROVIDER_SITE_OTHER): Payer: 59 | Admitting: Dermatology

## 2022-08-03 ENCOUNTER — Encounter: Payer: Self-pay | Admitting: Dermatology

## 2022-08-03 DIAGNOSIS — L309 Dermatitis, unspecified: Secondary | ICD-10-CM

## 2022-08-03 MED ORDER — TRIAMCINOLONE ACETONIDE 0.1 % EX CREA
1.0000 | TOPICAL_CREAM | Freq: Two times a day (BID) | CUTANEOUS | 0 refills | Status: DC
Start: 2022-08-03 — End: 2023-12-05

## 2022-08-03 NOTE — Progress Notes (Signed)
   New Patient Visit   Subjective  Katherine Sanford is a 57 y.o. female who presents for the following: Rash  Patient has had a rash all over her body for three weeks now. Tried aloe vera, coconut oil, essential oils, benadryl, Prednisone, and hydroxyzine. Nothing has helped. No new detergent, medications, vitamins, no no pets in the home, no new products. Permethrin cream was given but was not started due to her believing that the rash was not scabies. The itchiness is about an 8 out of 10. No hx of eczema.   The following portions of the chart were reviewed this encounter and updated as appropriate: medications, allergies, medical history  Review of Systems:  No other skin or systemic complaints except as noted in HPI or Assessment and Plan.  Objective  Well appearing patient in no apparent distress; mood and affect are within normal limits.  A focused examination was performed of the following areas: Arms, legs, and trunk  Relevant exam findings are noted in the Assessment and Plan.  Right Upper Arm - Posterior Scaly pink papules and plaques    Assessment & Plan   Dermatitis, unspecified Right Upper Arm - Posterior  Discussed top differential dx being Pityriasis Rosea (PR)  vs Psoriasis.   Clinically I favor PR  Explained to pt that topical tx would be the same for both but long term management (PR being self limiting and Psoriasis being chronic ) would be different  Treatment: - Apply CeraVe anti itch lotion daily to entire body -Apply Triamcinolone 0.1% cream to itchy areas BID for up to 2 weeks then stop  Skin / nail biopsy - Right Upper Arm - Posterior Type of biopsy: punch   Informed consent: discussed and consent obtained   Timeout: patient name, date of birth, surgical site, and procedure verified   Procedure prep:  Patient was prepped and draped in usual sterile fashion Prep type:  Isopropyl alcohol Anesthesia: the lesion was anesthetized in a  standard fashion   Anesthetic:  1% lidocaine w/ epinephrine 1-100,000 buffered w/ 8.4% NaHCO3 Punch size:  4 mm Suture size:  4-0 Suture type: nylon   Hemostasis achieved with: suture   Outcome: patient tolerated procedure well   Post-procedure details: sterile dressing applied and wound care instructions given   Dressing type: petrolatum and bandage    triamcinolone cream (KENALOG) 0.1 % - Right Upper Arm - Posterior Apply 1 Application topically 2 (two) times daily. Apply topically to the affected area twice a day for up to two weeks  Specimen 1 - Surgical pathology Differential Diagnosis: pityriasis rosea vs psoriasis   Check Margins: No     Return in about 2 weeks (around 08/17/2022) for suture removal and follow up.    Documentation: I have reviewed the above documentation for accuracy and completeness, and I agree with the above.  Langston Reusing, MD  I, Germaine Pomfret, CMA, am acting as scribe for Langston Reusing, MD.

## 2022-08-03 NOTE — Patient Instructions (Addendum)
Due to recent changes in healthcare laws, you may see results of your pathology and/or laboratory studies on MyChart before the doctors have had a chance to review them. We understand that in some cases there may be results that are confusing or concerning to you. Please understand that not all results are received at the same time and often the doctors may need to interpret multiple results in order to provide you with the best plan of care or course of treatment. Therefore, we ask that you please give us 2 business days to thoroughly review all your results before contacting the office for clarification. Should we see a critical lab result, you will be contacted sooner.   If You Need Anything After Your Visit  If you have any questions or concerns for your doctor, please call our main line at [phone] If no one answers, please leave a voicemail as directed and we will return your call as soon as possible. Messages left after 4 pm will be answered the following business day.   You may also send us a message via MyChart. We typically respond to MyChart messages within 1-2 business days.  For prescription refills, please ask your pharmacy to contact our office. Our fax number is [phone].  If you have an urgent issue when the clinic is closed that cannot wait until the next business day, you can page your doctor at the number below.    Please note that while we do our best to be available for urgent issues outside of office hours, we are not available 24/7.   If you have an urgent issue and are unable to reach us, you may choose to seek medical care at your doctor's office, retail clinic, urgent care center, or emergency room.  If you have a medical emergency, please immediately call 911 or go to the emergency department. In the event of inclement weather, please call our main line at [phone] for an update on the status of any delays or closures.  Dermatology Medication Tips: Please  keep the boxes that topical medications come in in order to help keep track of the instructions about where and how to use these. Pharmacies typically print the medication instructions only on the boxes and not directly on the medication tubes.   If your medication is too expensive, please contact our office at [phone] or send us a message through MyChart.   We are unable to tell what your co-pay for medications will be in advance as this is different depending on your insurance coverage. However, we may be able to find a substitute medication at lower cost or fill out paperwork to get insurance to cover a needed medication.   If a prior authorization is required to get your medication covered by your insurance company, please allow us 1-2 business days to complete this process.  Drug prices often vary depending on where the prescription is filled and some pharmacies may offer cheaper prices.  The website www.goodrx.com contains coupons for medications through different pharmacies. The prices here do not account for what the cost may be with help from insurance (it may be cheaper with your insurance), but the website can give you the price if you did not use any insurance.  - You can print the associated coupon and take it with your prescription to the pharmacy.  - You may also stop by our office during regular business hours and pick up a GoodRx coupon card.  - If you need your   prescription sent electronically to a different pharmacy, notify our office through [facility] MyChart or by phone at [phone]     

## 2022-08-09 NOTE — Progress Notes (Signed)
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  Pityriasis Rosea is caused by a reaction to a cold virus (often so mild you don't even feel symptoms)   Please continue the treatment regiment provided to you at your office visit.  We will discuss the condition in detail and answer any questions at your suture removal visit.   Kind regards,  Dr. Onalee Hua

## 2022-08-17 ENCOUNTER — Encounter: Payer: Self-pay | Admitting: Dermatology

## 2022-08-17 ENCOUNTER — Ambulatory Visit (INDEPENDENT_AMBULATORY_CARE_PROVIDER_SITE_OTHER): Payer: 59 | Admitting: Dermatology

## 2022-08-17 VITALS — BP 90/59

## 2022-08-17 DIAGNOSIS — L42 Pityriasis rosea: Secondary | ICD-10-CM | POA: Diagnosis not present

## 2022-08-17 NOTE — Progress Notes (Signed)
   Follow-Up Visit   Subjective  Katherine Sanford is a 57 y.o. female who presents for the following: Suture removal  Pathology showed: see below  The following portions of the chart were reviewed this encounter and updated as appropriate: medications, allergies, medical history  Review of Systems:  No other skin or systemic complaints except as noted in HPI or Assessment and Plan.  Objective  Well appearing patient in no apparent distress; mood and affect are within normal limits.  Areas Examined: Right upper arm  Relevant physical exam findings are noted in the Assessment and Plan.  PATHOLOGY RESULTS Skin , right upper arm - posterior SLIGHT SPONGIOTIC DERMATITIS, SEE DESCRIPTION Microscopic Description There is a superficial perivascular lymphocytic infiltrate associated with slight spongiosis and delicate mounds of parakeratosis. These are the changes of a subtle spongiotic dermatitis. The differential diagnosis includes pityriasis rosea and superficial gyrate erythema. Less likely is contact or nummular dermatitis or an id reaction. Following review of the hematoxylin and eosin sections, a PAS stain was obtained to exclude a fungal infection. The PAS stain is negative for fungal organisms.  Assessment & Plan    Encounter for Removal of Sutures - Incision site is clean, dry and intact. - Wound cleansed, sutures removed, wound cleansed and bandaid applied.  - Scars remodel for a full year. - patient can apply over-the-counter silicone scar cream once to twice a day to help with scar remodeling if desired. - Patient advised to call with any concerns or if they notice any new or changing lesions.   PITYRIASIS ROSEA Exam: resolving, no active lesions today  Treatment Plan: -Reviewed pathology report with patient in detail -Reassured pt this is a benign self-limiting viral axanthum. -Continue Gentle skin care -Can continue topical steroid cream as needed for  itch   Return if symptoms worsen or fail to improve.     Langston Reusing

## 2022-09-16 ENCOUNTER — Ambulatory Visit
Admission: RE | Admit: 2022-09-16 | Discharge: 2022-09-16 | Disposition: A | Discharge status: Home / Self Care | Payer: 59 | Source: Ambulatory Visit | Attending: Family Medicine | Admitting: Family Medicine

## 2022-09-16 DIAGNOSIS — Z1231 Encounter for screening mammogram for malignant neoplasm of breast: Secondary | ICD-10-CM

## 2023-08-18 ENCOUNTER — Ambulatory Visit: Admitting: Family Medicine

## 2023-09-26 ENCOUNTER — Ambulatory Visit: Admitting: Family Medicine

## 2023-10-10 ENCOUNTER — Ambulatory Visit: Admitting: Family Medicine

## 2023-12-05 ENCOUNTER — Ambulatory Visit (INDEPENDENT_AMBULATORY_CARE_PROVIDER_SITE_OTHER): Admitting: Family Medicine

## 2023-12-05 ENCOUNTER — Encounter: Payer: Self-pay | Admitting: Family Medicine

## 2023-12-05 VITALS — BP 112/78 | HR 68 | Temp 98.7°F | Ht 67.0 in | Wt 161.2 lb

## 2023-12-05 DIAGNOSIS — R202 Paresthesia of skin: Secondary | ICD-10-CM

## 2023-12-05 DIAGNOSIS — E559 Vitamin D deficiency, unspecified: Secondary | ICD-10-CM

## 2023-12-05 DIAGNOSIS — Z8619 Personal history of other infectious and parasitic diseases: Secondary | ICD-10-CM

## 2023-12-05 DIAGNOSIS — R29898 Other symptoms and signs involving the musculoskeletal system: Secondary | ICD-10-CM

## 2023-12-05 DIAGNOSIS — D649 Anemia, unspecified: Secondary | ICD-10-CM | POA: Diagnosis not present

## 2023-12-05 DIAGNOSIS — Z0001 Encounter for general adult medical examination with abnormal findings: Secondary | ICD-10-CM | POA: Diagnosis not present

## 2023-12-05 DIAGNOSIS — R252 Cramp and spasm: Secondary | ICD-10-CM

## 2023-12-05 DIAGNOSIS — Z131 Encounter for screening for diabetes mellitus: Secondary | ICD-10-CM | POA: Diagnosis not present

## 2023-12-05 DIAGNOSIS — Z13220 Encounter for screening for lipoid disorders: Secondary | ICD-10-CM

## 2023-12-05 DIAGNOSIS — Z01419 Encounter for gynecological examination (general) (routine) without abnormal findings: Secondary | ICD-10-CM

## 2023-12-05 LAB — CBC WITH DIFFERENTIAL/PLATELET
Basophils Absolute: 0 K/uL (ref 0.0–0.1)
Basophils Relative: 0.8 % (ref 0.0–3.0)
Eosinophils Absolute: 0.1 K/uL (ref 0.0–0.7)
Eosinophils Relative: 1.1 % (ref 0.0–5.0)
HCT: 37 % (ref 36.0–46.0)
Hemoglobin: 12.4 g/dL (ref 12.0–15.0)
Lymphocytes Relative: 32.2 % (ref 12.0–46.0)
Lymphs Abs: 1.8 K/uL (ref 0.7–4.0)
MCHC: 33.6 g/dL (ref 30.0–36.0)
MCV: 87.7 fl (ref 78.0–100.0)
Monocytes Absolute: 0.4 K/uL (ref 0.1–1.0)
Monocytes Relative: 7.9 % (ref 3.0–12.0)
Neutro Abs: 3.2 K/uL (ref 1.4–7.7)
Neutrophils Relative %: 58 % (ref 43.0–77.0)
Platelets: 325 K/uL (ref 150.0–400.0)
RBC: 4.22 Mil/uL (ref 3.87–5.11)
RDW: 13.3 % (ref 11.5–15.5)
WBC: 5.5 K/uL (ref 4.0–10.5)

## 2023-12-05 LAB — BASIC METABOLIC PANEL WITH GFR
BUN: 12 mg/dL (ref 6–23)
CO2: 30 meq/L (ref 19–32)
Calcium: 9.5 mg/dL (ref 8.4–10.5)
Chloride: 103 meq/L (ref 96–112)
Creatinine, Ser: 0.62 mg/dL (ref 0.40–1.20)
GFR: 97.07 mL/min (ref >60.00)
Glucose, Bld: 88 mg/dL (ref 70–99)
Potassium: 4.3 meq/L (ref 3.5–5.1)
Sodium: 139 meq/L (ref 135–145)

## 2023-12-05 LAB — VITAMIN D 25 HYDROXY (VIT D DEFICIENCY, FRACTURES): VITD: 25.79 ng/mL — ABNORMAL LOW (ref 30.00–100.00)

## 2023-12-05 LAB — TSH: TSH: 1.07 u[IU]/mL (ref 0.35–5.50)

## 2023-12-05 LAB — IBC PANEL
Iron: 61 ug/dL (ref 42–145)
Saturation Ratios: 16.2 % — ABNORMAL LOW (ref 20.0–50.0)
TIBC: 376.6 ug/dL (ref 250.0–450.0)
Transferrin: 269 mg/dL (ref 212.0–360.0)

## 2023-12-05 LAB — VITAMIN B12: Vitamin B-12: 698 pg/mL (ref 211–911)

## 2023-12-05 LAB — LIPID PANEL
Cholesterol: 219 mg/dL — ABNORMAL HIGH (ref 0–200)
HDL: 70.4 mg/dL (ref >39.00)
LDL Cholesterol: 130 mg/dL — ABNORMAL HIGH (ref 0–99)
NonHDL: 148.89
Total CHOL/HDL Ratio: 3
Triglycerides: 92 mg/dL (ref 0.0–149.0)
VLDL: 18.4 mg/dL (ref 0.0–40.0)

## 2023-12-05 LAB — FOLATE: Folate: 15.9 ng/mL (ref >5.9)

## 2023-12-05 LAB — FERRITIN: Ferritin: 34.2 ng/mL (ref 10.0–291.0)

## 2023-12-05 NOTE — Progress Notes (Signed)
 Ph: [phone] Fax: [phone]   Patient ID: Katherine Sanford, female    DOB: [birth year], 58 y.o.   MRN: [id number]  This visit was conducted in person.  BP 112/78   Pulse 68   Temp 98.7 F (37.1 C) (Oral)   Ht 5' 7 (1.702 m)   Wt 161 lb 4 oz (73.1 kg)   LMP  (LMP Unknown) Comment: LMP 2014  SpO2 98%   BMI 25.26 kg/m    CC: CPE Subjective:   HPI: Katherine Sanford is a 58 y.o. female presenting on 12/05/2023 for Annual Exam   Upcoming trip to [place] and [place].   Bilat hand numbness worse at night over the past 2-3 weeks associated with arm and leg cramping. Also feels heat throughout body. Notes increased sweating.   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 Would rather avoid surgery.   6 months of R popliteal fullness that started on trip to [place] - has taken PRN tylenol with temporary benefit.   Non-fasting - banana, coffee 3 hrs ago  Colonoscopy /EGD in [place] ~2018 - pos H pylori (was having dysphagia) s/p treatment, never test of cure. Colonoscopy returned normal. She will send us  name of GI practice to request records.   Preventative: Colon cancer screening - in [place].  Lung cancer screening - not eligible M[facility]22 [facility] - # provided to reschedule  Well woman exam - last 10 yrs ago - due. Never head about scheduling this last year - will rerefer again this year. No vaginal bleed.  LMP ~2015  DEXA scan - not due Flu shot - declines COVID shot - declined Tdap- 2019 Pneumonia shot - discussed, declines.  Shingrix - discussed, declines. H/o shingles ~2015 Advanced directive discussion - not discussed Seat belt use discussed Sunscreen use discussed. No changing moles on skin. 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 Interim medical history since our last visit reviewed. Allergies and medications reviewed and updated. Outpatient Medications Prior to Visit  Medication Sig Dispense Refill   amoxicillin  (AMOXIL ) 500 MG capsule Take 500 mg by mouth 3 (three) times daily.     hydrOXYzine  (ATARAX ) 25 MG tablet Take 1 tablet (25 mg total) by mouth 3 (three) times daily as needed for itching (sedation precautions). 30 tablet 0   predniSONE  (DELTASONE ) 20 MG tablet Take two tablets daily for 4 days followed by one tablet daily for 4 days (Patient not taking: Reported on 08/03/2022) 12 tablet 0   triamcinolone  cream (KENALOG ) 0.1 % Apply 1 Application topically 2 (two) times daily. Apply topically to the affected area twice a day for up to two weeks 453 g 0   No facility-administered medications prior to visit.     Per HPI unless specifically indicated in ROS section below Review of Systems  Constitutional:  Negative for activity change, appetite change, chills, fatigue, fever and unexpected weight change.  HENT:  Negative for hearing loss.   Eyes:  Negative for visual disturbance.  Respiratory:  Negative for cough, chest tightness, shortness of breath and wheezing.   Cardiovascular:  Positive for leg swelling (occ). Negative for chest pain and palpitations.  Gastrointestinal:  Negative for abdominal distention, abdominal pain, blood in stool, constipation, diarrhea, nausea and vomiting.  Genitourinary:  Negative for difficulty urinating and hematuria.  Musculoskeletal:  Negative for arthralgias, myalgias and neck pain.  Skin:  Negative for rash.  Neurological:  Negative for dizziness, seizures, syncope and headaches.   Hematological:  Negative for adenopathy. Does not bruise/bleed easily.  Psychiatric/Behavioral:  Negative for dysphoric mood. The patient is not nervous/anxious.     Objective:  BP 112/78   Pulse 68   Temp 98.7 F (37.1 C) (Oral)   Ht 5' 7 (1.702 m)   Wt 161 lb 4 oz (73.1 kg)   LMP  (LMP Unknown) Comment: LMP 2014  SpO2 98%   BMI 25.26 kg/m   Wt Readings from Last 3 Encounters:  12/05/23 161 lb 4 oz (73.1 kg)  07/19/22 157 lb 2 oz (71.3 kg)  01/28/22 156 lb (70.8 kg)      Physical Exam Vitals and nursing note reviewed.  Constitutional:      Appearance: Normal appearance. She is not ill-appearing.  HENT:     Head: Normocephalic and atraumatic.     Right Ear: Tympanic membrane, ear canal and external ear normal. There is no impacted cerumen.     Left Ear: Tympanic membrane, ear canal and external ear normal. There is no impacted cerumen.     Mouth/Throat:     Mouth: Mucous membranes are moist.     Pharynx: Oropharynx is clear. No oropharyngeal exudate or posterior oropharyngeal erythema.  Eyes:     General:        Right eye: No discharge.        Left eye: No discharge.     Extraocular Movements: Extraocular movements intact.     Conjunctiva/sclera: Conjunctivae normal.     Pupils: Pupils are equal, round, and reactive to light.  Neck:     Thyroid : No thyroid  mass or thyromegaly.  Cardiovascular:     Rate and Rhythm: Normal rate and regular rhythm.     Pulses: Normal pulses.     Heart sounds: Normal heart sounds. No murmur heard. Pulmonary:     Effort: Pulmonary effort is normal. No respiratory distress.     Breath sounds: Normal breath sounds. No wheezing, rhonchi or rales.  Abdominal:     General: Bowel sounds are normal. There is no distension.     Palpations: Abdomen is soft. There is no mass.     Tenderness: There is no abdominal tenderness. There is no guarding or rebound.     Hernia: No hernia is present.  Musculoskeletal:     Cervical back: Normal range  of motion and neck supple. No rigidity.     Right lower leg: No edema.     Left lower leg: No edema.     Comments:  2+ rad pulses bilaterally  L knee WNL R knee exam: No deformity on inspection. No pain with palpation of knee landmarks. No effusion/swelling noted. FROM in flex/extension with some crepitus. ++ popliteal fullness. Neg drawer test. Neg mcmurray test. No pain with valgus/varus stress. No PFgrind. No abnormal patellar mobility.   Lymphadenopathy:     Cervical: No cervical adenopathy.  Skin:    General: Skin is warm and dry.     Findings: No rash.  Neurological:     General: No focal deficit  present.     Mental Status: She is alert. Mental status is at baseline.  Psychiatric:        Mood and Affect: Mood normal.        Behavior: Behavior normal.       Results for orders placed or performed in visit on 07/25/22  Alpha-Gal Panel   Collection Time: 07/25/22  8:48 AM  Result Value Ref Range   Beef <0.10 kU/L   CLASS 0    Allergen, Mutton, f88 <0.10 kU/L   Class 0    Allergen, Pork, f26 <0.10 kU/L   CLASS 0    GALACTOSE-ALPHA-1,3-GALACTOSE IGE* <0.10 <0.10 kU/L  ANA   Collection Time: 07/25/22  8:48 AM  Result Value Ref Range   Anti Nuclear Antibody (ANA) NEGATIVE NEGATIVE  Sedimentation rate   Collection Time: 07/25/22  8:48 AM  Result Value Ref Range   Sed Rate 4 0 - 30 mm/hr  TSH   Collection Time: 07/25/22  8:48 AM  Result Value Ref Range   TSH 2.39 0.35 - 5.50 uIU/mL  Comprehensive metabolic panel   Collection Time: 07/25/22  8:48 AM  Result Value Ref Range   Sodium 137 135 - 145 mEq/L   Potassium 3.8 3.5 - 5.1 mEq/L   Chloride 104 96 - 112 mEq/L   CO2 29 19 - 32 mEq/L   Glucose, Bld 80 70 - 99 mg/dL   BUN 12 6 - 23 mg/dL   Creatinine, Ser 9.11 0.40 - 1.20 mg/dL   Total Bilirubin 0.3 0.2 - 1.2 mg/dL   Alkaline Phosphatase 48 39 - 117 U/L   AST 13 0 - 37 U/L   ALT 15 0 - 35 U/L   Total Protein 6.7 6.0 - 8.3 g/dL   Albumin 4.3 3.5 - 5.2  g/dL   GFR 27.67 >39.99 mL/min   Calcium 9.2 8.4 - 10.5 mg/dL  CBC with Differential/Platelet   Collection Time: 07/25/22  8:48 AM  Result Value Ref Range   WBC 6.5 4.0 - 10.5 K/uL   RBC 3.92 3.87 - 5.11 Mil/uL   Hemoglobin 11.6 (L) 12.0 - 15.0 g/dL   HCT 65.4 (L) 63.9 - 53.9 %   MCV 88.0 78.0 - 100.0 fl   MCHC 33.8 30.0 - 36.0 g/dL   RDW 86.6 88.4 - 84.4 %   Platelets 313.0 150.0 - 400.0 K/uL   Neutrophils Relative % 46.5 43.0 - 77.0 %   Lymphocytes Relative 41.9 12.0 - 46.0 %   Monocytes Relative 7.5 3.0 - 12.0 %   Eosinophils Relative 2.9 0.0 - 5.0 %   Basophils Relative 1.2 0.0 - 3.0 %   Neutro Abs 3.0 1.4 - 7.7 K/uL   Lymphs Abs 2.7 0.7 - 4.0 K/uL   Monocytes Absolute 0.5 0.1 - 1.0 K/uL   Eosinophils Absolute 0.2 0.0 - 0.7 K/uL   Basophils Absolute 0.1 0.0 - 0.1 K/uL  Interpretation:   Collection Time: 07/25/22  8:48 AM  Result Value Ref Range   Interpretation      Assessment & Plan:   Problem List Items Addressed This Visit     Encounter for general adult medical examination with abnormal findings - Primary (Chronic)   Preventative protocols reviewed and updated unless pt declined. Discussed healthy diet and lifestyle.  Referral placed to GYN  She will schedule dental exam and eye exam when she returns from upcoming trip to [place].       Vitamin D  deficiency   Update labs off regular  replacement.      Relevant Orders   VITAMIN D  25 Hydroxy (Vit-D Deficiency, Fractures)   Paresthesias   Bilateral hand into arm numbness and tenderness in h/o CTS - she does use night time wrist bracing PRN.  Check B12 and folate to ensure vit deficiency not contributing. Consider checking thiamine (b1).       Relevant Orders   CBC with Differential/Platelet   Vitamin B12   Folate   TSH   Anemia   H/o mild anemia with Hgb 11.6 (07/2022)  Update anemia panel including vitamins and iron levels.       Relevant Orders   CBC with Differential/Platelet   Ferritin   IBC  panel   Popliteal fullness   Story/exam most consistent with R Baker's cyst - discussed with patient as well as this could be a sign of internal knee derangement / osteoarthritis. Recommend knee brace for prolonged walking, rest, elevation, update if enlarging or more symptomatic to consider ortho eval /aspiration.       History of Helicobacter pylori infection   S/p treatment ~2018, never TOC. Remains asxs      Other Visit Diagnoses       Lipid screening       Relevant Orders   Lipid panel     Diabetes mellitus screening       Relevant Orders   Basic metabolic panel with GFR     Muscle cramp       Relevant Orders   Magnesium     Well woman exam       Relevant Orders   Ambulatory referral to Gynecology        No orders of the defined types were placed in this encounter.   Orders Placed This Encounter  Procedures   Lipid panel   Basic metabolic panel with GFR   CBC with Differential/Platelet   Vitamin B12   Ferritin   IBC panel   Folate   TSH   Magnesium   VITAMIN D  25 Hydroxy (Vit-D Deficiency, Fractures)   Ambulatory referral to Gynecology    Referral Priority:   Routine    Referral Type:   Consultation    Referral Reason:   Specialty Services Required    Requested Specialty:   Gynecology    Number of Visits Requested:   1    Patient Instructions  Laboratorios hoy  Dejeme saber nombre de gastroenterologo o clinica en [place] donde hizo colonoscopia.  He hecho nueva remision a Banker.  Call to schedule mammogram at your convenience: [facility] [phone] Creo que tiene quiste de Engineer, production en la rodilla derecha.  Considere vacuna para prevenir pneumonia y [place].  Regresar en 1 ao para proximo examen fisico   Follow up plan: Return in about 1 year (around 12/04/2024), or if symptoms worsen or fail to improve.  Anton Blas, MD

## 2023-12-05 NOTE — Patient Instructions (Addendum)
 Laboratorios hoy  Dejeme saber nombre de gastroenterologo o [facility] en [place] donde hizo colonoscopia.  He hecho nueva remision a Banker.  Call to schedule mammogram at your convenience: [facility] [phone] Creo que tiene quiste de Engineer, production en la rodilla derecha.  Considere vacuna para prevenir pneumonia y [place].  Regresar en 1 ao para proximo examen fisico

## 2023-12-07 DIAGNOSIS — R29898 Other symptoms and signs involving the musculoskeletal system: Secondary | ICD-10-CM | POA: Insufficient documentation

## 2023-12-07 DIAGNOSIS — Z0001 Encounter for general adult medical examination with abnormal findings: Secondary | ICD-10-CM | POA: Insufficient documentation

## 2023-12-07 DIAGNOSIS — R202 Paresthesia of skin: Secondary | ICD-10-CM | POA: Insufficient documentation

## 2023-12-07 DIAGNOSIS — D649 Anemia, unspecified: Secondary | ICD-10-CM | POA: Insufficient documentation

## 2023-12-07 LAB — MAGNESIUM: Magnesium: 2.2 mg/dL (ref 1.5–2.5)

## 2023-12-07 NOTE — Assessment & Plan Note (Signed)
 Update labs off regular replacement.

## 2023-12-07 NOTE — Assessment & Plan Note (Addendum)
 Preventative protocols reviewed and updated unless pt declined. Discussed healthy diet and lifestyle.  Referral placed to GYN  She will schedule dental exam and eye exam when she returns from upcoming trip to [place].

## 2023-12-07 NOTE — Assessment & Plan Note (Addendum)
 Story/exam most consistent with R Baker's cyst - discussed with patient as well as this could be a sign of internal knee derangement / osteoarthritis. Recommend knee brace for prolonged walking, rest, elevation, update if enlarging or more symptomatic to consider ortho eval /aspiration.

## 2023-12-07 NOTE — Assessment & Plan Note (Addendum)
 H/o mild anemia with Hgb 11.6 (07/2022)  Update anemia panel including vitamins and iron levels.

## 2023-12-07 NOTE — Assessment & Plan Note (Signed)
 Bilateral hand into arm numbness and tenderness in h/o CTS - she does use night time wrist bracing PRN.  Check B12 and folate to ensure vit deficiency not contributing. Consider checking thiamine (b1).

## 2023-12-07 NOTE — Assessment & Plan Note (Addendum)
 S/p treatment ~2018, never TOC. Remains asxs

## 2023-12-11 ENCOUNTER — Ambulatory Visit: Payer: Self-pay | Admitting: Family Medicine

## 2023-12-11 DIAGNOSIS — E611 Iron deficiency: Secondary | ICD-10-CM | POA: Insufficient documentation

## 2023-12-14 ENCOUNTER — Encounter (HOSPITAL_BASED_OUTPATIENT_CLINIC_OR_DEPARTMENT_OTHER): Payer: Self-pay

## 2024-02-12 NOTE — Progress Notes (Unsigned)
 58 y.o. No obstetric history on file. Married Declined female here for annual exam.    No LMP recorded (lmp unknown). Patient is postmenopausal.          Sexually active: {yes no:314532}  The current method of family planning is {contraception:315051}.     The pregnancy intention screening data noted above was reviewed. Potential methods of contraception were discussed. The patient elected to proceed with No data recorded.  Exercising: {yes no:314532}  {types:19826} Smoker:  {YES NO:22349}  Health Maintenance: Pap:  *** History of abnormal Pap:  {YES NO:22349} MMG:  09/16/2022 Birads 1 Neg Colonoscopy:  *** BMD:   *** Screening Labs: ***   reports that she has never smoked. She has never used smokeless tobacco. She reports that she does not currently use alcohol. She reports that she does not use drugs.  Past Medical History:  Diagnosis Date   Heartburn    History of chicken pox    Migraines    Shingles 2015    Past Surgical History:  Procedure Laterality Date   CESAREAN SECTION  2004   HEMORRHOIDECTOMY WITH HEMORRHOID BANDING  1999    No current outpatient medications on file.   No current facility-administered medications for this visit.    Family History  Problem Relation Age of Onset   Alcohol abuse Father    Heart attack Father 24   Post-traumatic stress disorder Father    Diabetes Sister        prediabetes   Cancer Neg Hx    Breast cancer Neg Hx     ROS: Constitutional: {Findings; ROS constitutional:30497::negative} Genitourinary:{Findings; ROS genitourinary:19593::negative}  Exam:   LMP  (LMP Unknown) Comment: LMP 2014     General appearance: alert, cooperative and appears stated age Head: Normocephalic, without obvious abnormality, atraumatic Neck: no adenopathy, supple, symmetrical, trachea midline and thyroid  {EXAM; THYROID :18604} Lungs: clear to auscultation bilaterally Breasts: {Exam; breast:13139::normal appearance, no masses or  tenderness} Heart: regular rate and rhythm Abdomen: soft, non-tender; bowel sounds normal; no masses,  no organomegaly Extremities: extremities normal, atraumatic, no cyanosis or edema Skin: Skin color, texture, turgor normal. No rashes or lesions Lymph nodes: Cervical, supraclavicular, and axillary nodes normal. No abnormal inguinal nodes palpated Neurologic: Grossly normal   Pelvic: External genitalia:  no lesions              Urethra:  normal appearing urethra with no masses, tenderness or lesions              Bartholins and Skenes: normal                 Vagina: normal appearing vagina with normal color and no discharge, no lesions              Cervix: {exam; cervix:14595}              Pap taken: {yes no:314532} Bimanual Exam:  Uterus:  {exam; uterus:12215}              Adnexa: {exam; adnexa:12223}               Rectovaginal: Confirms               Anus:  normal sphincter tone, no lesions  Chaperone, ***, CMA, was present for exam.  Assessment/Plan:

## 2024-02-13 ENCOUNTER — Ambulatory Visit (INDEPENDENT_AMBULATORY_CARE_PROVIDER_SITE_OTHER): Admitting: Certified Nurse Midwife

## 2024-02-13 ENCOUNTER — Encounter (HOSPITAL_BASED_OUTPATIENT_CLINIC_OR_DEPARTMENT_OTHER): Payer: Self-pay | Admitting: Certified Nurse Midwife

## 2024-02-13 ENCOUNTER — Other Ambulatory Visit (HOSPITAL_COMMUNITY)
Admission: RE | Admit: 2024-02-13 | Discharge: 2024-02-13 | Disposition: A | Source: Ambulatory Visit | Attending: Certified Nurse Midwife | Admitting: Certified Nurse Midwife

## 2024-02-13 VITALS — BP 111/63 | HR 81 | Ht 67.0 in | Wt 164.8 lb

## 2024-02-13 DIAGNOSIS — Z780 Asymptomatic menopausal state: Secondary | ICD-10-CM

## 2024-02-13 DIAGNOSIS — Z01419 Encounter for gynecological examination (general) (routine) without abnormal findings: Secondary | ICD-10-CM

## 2024-02-13 DIAGNOSIS — Z1231 Encounter for screening mammogram for malignant neoplasm of breast: Secondary | ICD-10-CM

## 2024-02-13 DIAGNOSIS — Z124 Encounter for screening for malignant neoplasm of cervix: Secondary | ICD-10-CM

## 2024-02-20 LAB — CYTOLOGY - PAP
Comment: NEGATIVE
Diagnosis: NEGATIVE
High risk HPV: NEGATIVE

## 2024-02-23 ENCOUNTER — Ambulatory Visit (HOSPITAL_BASED_OUTPATIENT_CLINIC_OR_DEPARTMENT_OTHER): Payer: Self-pay | Admitting: Certified Nurse Midwife

## 2024-12-02 ENCOUNTER — Other Ambulatory Visit

## 2024-12-09 ENCOUNTER — Encounter: Admitting: Family Medicine
